# Patient Record
Sex: Female | Born: 1980 | Race: White | Hispanic: No | Marital: Single | Smoking: Current every day smoker
Health system: Southern US, Community
[De-identification: ages and names within clinical notes are randomized; demographics above are authoritative.]

## PROBLEM LIST (undated history)

## (undated) ENCOUNTER — Inpatient Hospital Stay (HOSPITAL_COMMUNITY): Payer: Self-pay

## (undated) DIAGNOSIS — J42 Unspecified chronic bronchitis: Secondary | ICD-10-CM

## (undated) HISTORY — DX: Unspecified chronic bronchitis: J42

## (undated) HISTORY — PX: OTHER SURGICAL HISTORY: SHX169

## (undated) HISTORY — PX: LEEP: SHX91

## (undated) HISTORY — PX: WISDOM TOOTH EXTRACTION: SHX21

---
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
## 1999-01-26 ENCOUNTER — Emergency Department (HOSPITAL_COMMUNITY): Admission: EM | Admit: 1999-01-26 | Discharge: 1999-01-26 | Payer: Self-pay | Admitting: Endocrinology

## 1999-07-25 ENCOUNTER — Inpatient Hospital Stay (HOSPITAL_COMMUNITY): Admission: AD | Admit: 1999-07-25 | Discharge: 1999-07-25 | Payer: Self-pay | Admitting: *Deleted

## 1999-09-18 ENCOUNTER — Inpatient Hospital Stay (HOSPITAL_COMMUNITY): Admission: EM | Admit: 1999-09-18 | Discharge: 1999-09-18 | Payer: Self-pay | Admitting: *Deleted

## 1999-10-06 ENCOUNTER — Inpatient Hospital Stay (HOSPITAL_COMMUNITY): Admission: AD | Admit: 1999-10-06 | Discharge: 1999-10-06 | Payer: Self-pay | Admitting: Obstetrics

## 2000-01-27 ENCOUNTER — Encounter: Admission: RE | Admit: 2000-01-27 | Discharge: 2000-01-27 | Payer: Self-pay | Admitting: Cardiology

## 2000-01-27 ENCOUNTER — Encounter: Payer: Self-pay | Admitting: Cardiology

## 2001-09-06 ENCOUNTER — Other Ambulatory Visit: Admission: RE | Admit: 2001-09-06 | Discharge: 2001-09-06 | Payer: Self-pay | Admitting: Obstetrics

## 2001-10-27 ENCOUNTER — Encounter (INDEPENDENT_AMBULATORY_CARE_PROVIDER_SITE_OTHER): Payer: Self-pay

## 2001-10-27 ENCOUNTER — Ambulatory Visit (HOSPITAL_COMMUNITY): Admission: RE | Admit: 2001-10-27 | Discharge: 2001-10-27 | Payer: Self-pay | Admitting: Obstetrics

## 2002-03-24 ENCOUNTER — Emergency Department (HOSPITAL_COMMUNITY): Admission: EM | Admit: 2002-03-24 | Discharge: 2002-03-24 | Payer: Self-pay | Admitting: *Deleted

## 2002-05-06 ENCOUNTER — Emergency Department (HOSPITAL_COMMUNITY): Admission: EM | Admit: 2002-05-06 | Discharge: 2002-05-06 | Payer: Self-pay

## 2002-10-07 ENCOUNTER — Encounter: Payer: Self-pay | Admitting: Emergency Medicine

## 2002-10-07 ENCOUNTER — Emergency Department (HOSPITAL_COMMUNITY): Admission: EM | Admit: 2002-10-07 | Discharge: 2002-10-07 | Payer: Self-pay | Admitting: Emergency Medicine

## 2003-07-05 ENCOUNTER — Encounter: Admission: RE | Admit: 2003-07-05 | Discharge: 2003-07-05 | Payer: Self-pay | Admitting: Obstetrics and Gynecology

## 2003-08-02 ENCOUNTER — Encounter: Admission: RE | Admit: 2003-08-02 | Discharge: 2003-08-02 | Payer: Self-pay | Admitting: Obstetrics and Gynecology

## 2003-09-03 ENCOUNTER — Inpatient Hospital Stay (HOSPITAL_COMMUNITY): Admission: AD | Admit: 2003-09-03 | Discharge: 2003-09-03 | Payer: Self-pay | Admitting: Obstetrics & Gynecology

## 2003-09-04 ENCOUNTER — Encounter: Admission: RE | Admit: 2003-09-04 | Discharge: 2003-09-04 | Payer: Self-pay | Admitting: Obstetrics and Gynecology

## 2003-09-06 ENCOUNTER — Ambulatory Visit (HOSPITAL_COMMUNITY): Admission: RE | Admit: 2003-09-06 | Discharge: 2003-09-06 | Payer: Self-pay | Admitting: *Deleted

## 2003-11-08 ENCOUNTER — Encounter: Admission: RE | Admit: 2003-11-08 | Discharge: 2003-11-08 | Payer: Self-pay | Admitting: Obstetrics and Gynecology

## 2004-02-21 ENCOUNTER — Ambulatory Visit: Payer: Self-pay | Admitting: Obstetrics and Gynecology

## 2004-02-29 ENCOUNTER — Ambulatory Visit (HOSPITAL_COMMUNITY): Admission: RE | Admit: 2004-02-29 | Discharge: 2004-02-29 | Payer: Self-pay | Admitting: Obstetrics and Gynecology

## 2004-03-17 ENCOUNTER — Other Ambulatory Visit: Admission: RE | Admit: 2004-03-17 | Discharge: 2004-03-17 | Payer: Self-pay | Admitting: Obstetrics and Gynecology

## 2004-07-07 ENCOUNTER — Inpatient Hospital Stay (HOSPITAL_COMMUNITY): Admission: AD | Admit: 2004-07-07 | Discharge: 2004-07-07 | Payer: Self-pay | Admitting: Obstetrics and Gynecology

## 2004-07-14 ENCOUNTER — Inpatient Hospital Stay (HOSPITAL_COMMUNITY): Admission: AD | Admit: 2004-07-14 | Discharge: 2004-07-17 | Payer: Self-pay | Admitting: Obstetrics and Gynecology

## 2004-07-30 ENCOUNTER — Other Ambulatory Visit: Admission: RE | Admit: 2004-07-30 | Discharge: 2004-07-30 | Payer: Self-pay | Admitting: Obstetrics and Gynecology

## 2004-09-07 ENCOUNTER — Inpatient Hospital Stay (HOSPITAL_COMMUNITY): Admission: AD | Admit: 2004-09-07 | Discharge: 2004-09-07 | Payer: Self-pay | Admitting: Obstetrics and Gynecology

## 2004-09-08 ENCOUNTER — Inpatient Hospital Stay (HOSPITAL_COMMUNITY): Admission: AD | Admit: 2004-09-08 | Discharge: 2004-09-13 | Payer: Self-pay | Admitting: Obstetrics and Gynecology

## 2004-11-04 ENCOUNTER — Other Ambulatory Visit: Admission: RE | Admit: 2004-11-04 | Discharge: 2004-11-04 | Payer: Self-pay | Admitting: Obstetrics and Gynecology

## 2005-06-18 ENCOUNTER — Other Ambulatory Visit: Admission: RE | Admit: 2005-06-18 | Discharge: 2005-06-18 | Payer: Self-pay | Admitting: Obstetrics and Gynecology

## 2006-01-12 ENCOUNTER — Emergency Department (HOSPITAL_COMMUNITY): Admission: EM | Admit: 2006-01-12 | Discharge: 2006-01-12 | Payer: Self-pay | Admitting: Emergency Medicine

## 2007-08-05 ENCOUNTER — Inpatient Hospital Stay (HOSPITAL_COMMUNITY): Admission: EM | Admit: 2007-08-05 | Discharge: 2007-08-07 | Payer: Self-pay | Admitting: Emergency Medicine

## 2008-10-13 ENCOUNTER — Emergency Department (HOSPITAL_COMMUNITY): Admission: EM | Admit: 2008-10-13 | Discharge: 2008-10-13 | Payer: Self-pay | Admitting: Emergency Medicine

## 2008-10-15 ENCOUNTER — Emergency Department (HOSPITAL_COMMUNITY): Admission: EM | Admit: 2008-10-15 | Discharge: 2008-10-15 | Payer: Self-pay | Admitting: Emergency Medicine

## 2009-01-17 ENCOUNTER — Emergency Department (HOSPITAL_COMMUNITY): Admission: EM | Admit: 2009-01-17 | Discharge: 2009-01-17 | Payer: Self-pay | Admitting: Emergency Medicine

## 2010-06-22 ENCOUNTER — Encounter: Payer: Self-pay | Admitting: *Deleted

## 2010-10-14 NOTE — Discharge Summary (Signed)
NAMELICIA, HARL NO.:  [id number]   MEDICAL RECORD NO.:  [id number]          PATIENT TYPE:  INP   LOCATION:  1239                         FACILITY:  [facility]   PHYSICIAN:  Isidor Holts, M.D.  DATE OF BIRTH:  [birth year]   DATE OF ADMISSION:  08/05/2007  DATE OF DISCHARGE:  08/07/2007                               DISCHARGE SUMMARY   PMD:  Unassigned.   DISCHARGE DIAGNOSES:  1. Acute gastroenteritis.  2. Dehydration, volume depletion and hypotension.  3. Urinary tract infection.  4. Smoking history.   DISCHARGE MEDICATIONS:  1. Ciprofloxacin 500 mg p.o. b.i.d. for 2 days from August 08, 2007.  2. Loperamide 2 mg p.o. t.i.d. for 2 days only.   PROCEDURES:  None.   CONSULTATIONS:  None.   Admission history.  As H&P notes of August 05, 2007.  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  On initial evaluation, she was  found to be hypotensive and volume depleted, and was admitted for  further evaluation, investigation and management.   CLINICAL COURSE.:  1. Acute gastroenteritis.  For details of presentation, refer to      admission history above.  The patient was noted to have a BP of      93/67 in the emergency department, dropping further, to 87/55 mmHg      even after administration of about 4 liters of intravenous normal      saline.  She was managed with aggressive intravenous fluid      hydration, bowel rest, proton pump inhibitor, antiemetics.  Stool      samples sent off for C.  Difficile toxin were negative.  She      responded to above management measures and by a.m. of August 06, 2007      felt  considerably better and was able to manage clear fluids p.o.      By March 8, she was able to tolerate a regular diet and had no      further symptomatology.  Blood pressure was also found to be      reasonable at 107/60.  The patient was completely asymptomatic and      very keen to be discharged.   1. Dehydration.  The patient presented with profound dehydration,      volume depletion and hypotension.  As mentioned above, she was      managed with aggressive intravenous fluids and as of August 07, 2007,      blood pressure had normalized, hydration status was normal and the      patient was asymptomatic.   1. UTI.  This is an incidental finding.  Urinalysis demonstrated many      bacteria.  The patient was managed with a five day course of  Ciprofloxacin, to be completed on August 09, 2007.   4  Smoking history.  The patient does admit to smoking.  She has been  counseled appropriately, and was managed with Nicoderm patch during the  course of her hospitalization.   DISPOSITION:  The patient was on August 07, 2007, considered sufficiently  clinically recovered and stable to be discharged. She was therefore  discharged accordingly.  She is recommended to return to regular duties  on August 10, 2007.   ACTIVITY:  No restrictions.   DIET:  No restrictions.  Although she is cautioned to avoid raw fruits  and vegetables for the next couple of days   FOLLOW-UP INSTRUCTIONS:  The patient has been recommended to establish  primary MD for routine and preventative care.  However, no immediate  follow-up is indicated at this time.      Isidor Holts, M.D.  Electronically Signed     CO/MEDQ  D:  08/07/2007  T:  08/07/2007  Job:  [id number]

## 2010-10-14 NOTE — H&P (Signed)
Destiny Perry, Destiny Perry NO.:  [id number]   MEDICAL RECORD NO.:  [id number]          PATIENT TYPE:  INP   LOCATION:  1239                         FACILITY:  [facility]   PHYSICIAN:  Isidor Holts, M.D.  DATE OF BIRTH:  May 02, 1981   DATE OF ADMISSION:  08/05/2007  DATE OF DISCHARGE:                              HISTORY & PHYSICAL   PRIMARY CARE PHYSICIAN:  Unassigned.   PRIMARY GYN:  Janine Limbo, M.D.   CHIEF COMPLAINT:  Vomiting, diarrhea and abdominal discomfort for 2  days.   HISTORY OF PRESENT ILLNESS:  This is a 30 year old female.  The patient  is a very good historian.  According to her, she resides in [place],  but had traveled to [place], [place], to attend a funeral on  August 04, 2007.  On the way back, she stopped at a [employer] to eat,  consumed a double cheeseburger with fries at about 3:00 p.m. on August 04, 2007.  While driving back, she felt unwell, got home eventually at 4:30  to 5:00 p.m., her stomach started rumbling, and she had to go to the  bathroom.  While there, she passed liquid mucoid stools, no blood, and  since then, has had approximately 12 bowel movements and vomited at  least 6 times.  Denies coffee-ground emesis.  Has experienced crampy  abdominal discomfort but no fever, although she has had sweats and  chills.  She tried treating herself with Pepto-Bismol and Pedialyte on  August 04, 2007; however, these were quite unhelpful. She now feels very  weak.  Her boyfriend brought her to the emergency department on August 05, 2007, secondary to continuing symptoms.   PAST MEDICAL HISTORY:  1. Status post LEEP procedure January 2003 and cryosurgery June 2005      for cervical dysplasia.  2. History of HPV  3. Previous UTIs.  4. Smoking history.   MEDICATIONS:  IM Depo-Provera every three months, otherwise no other  medications.   ALLERGIES:  No known drug allergies.   REVIEW OF SYSTEMS:  As per HPI and Chief  Complaint.  As the patient ate  alone at [facility], if it is not known if there was any clustering of  cases.  Certainly she has had no sick contacts.   SOCIAL HISTORY:  The patient is single, works as a Social worker.  Has  one offspring who is alive and well. Smokes about one-quarter to one-  half package of cigarettes per day, has done so on and off for the past  10 years.  Drinks alcohol only occasionally.  Denies drug abuse.   FAMILY HISTORY:  The patient's parents are still living.  Mother is age  72 years, is a smoker and is hypertensive.  Father is age 47 years and  alive and well.  Family history is otherwise noncontributory.   PHYSICAL EXAMINATION:  VITAL SIGNS:  Temperature 97.9, pulse 76 per  minute and regular, respiratory rate 18, blood pressure initially 93/67  mmHg, now 87/55 mmHg, pulse oximeter 100% on room air.  GENERAL:  The patient does not appear to be  in obvious acute distress at  time of this evaluation, alert, communicative, not short of breath at  rest.  HEENT:  No clinical pallor or jaundice.  No conjunctival injection.  Throat: Visible mucous membranes appear dry.  NECK:  Supple.  JVP not seen.  No palpable lymphadenopathy.  No palpable  goiter.  No carotid bruits.  CHEST:  Clinically clear to auscultation.  No wheezes or crackles.  CARDIAC:  Heart sounds 1 and 2 heard, normal, regular, no murmurs.  ABDOMEN:  Flat, soft, nontender.  No palpable organomegaly or palpable  masses.  Brisk bowel sounds.  EXTREMITIES:  Lower extremity examination: No pitting edema.  Palpable  peripheral pulses.  MUSCULOSKELETAL SYSTEM:  Unremarkable.  CENTRAL NERVOUS SYSTEM:  No focal neurologic deficit on gross  examination.   INVESTIGATIONS:  CBC:  WBC 8.3, hemoglobin 15.2, hematocrit 43.3,  platelets 252.  Electrolytes:  Sodium 137, potassium 4.4, chloride 104,  CO2 26, BUN 14, creatinine 1.08, glucose 87.  AST 24, ALT 14, alkaline  phosphatase 57.  Urinalysis:  3-6 wbc's,  0-2 rbc's, bacteria many.   ASSESSMENT AND PLAN:  1. Acute gastroenteritis, query etiology.  Given short incubation      period as well as history of ingestion of a double cheeseburger,      one has to suspect bacterial food poisoning, possibly      staphylococcal etiology.  We shall admit the patient in the step-      down unit given hypotension, for close monitoring.  Institute bowel      rest, intravenous fluid hydration, antiemetics, proton pump      inhibitor and carry out stool studies including stool microscopy as      well as stool for C. difficile toxin, for completeness.  Because of      high frequency of diarrhea, we may need to utilize p.r.n.,      Loperamide for symptomatic relief.   1. Dehydration and hypotension.  This is secondary to #1 above.  We      shall manage with aggressive intravenous fluid hydration.   1. Urinary tract infection.  We shall commence Ciprofloxacin. This may      also be helpful for infective diarrhea.  We shall send off urine      for culture and sensitivity.   1. Smoking history.  The patient has been counseled appropriately.  We      shall utilize Nicoderm CQ patch.   Further management will depend on clinical course.      Isidor Holts, M.D.  Electronically Signed     CO/MEDQ  D:  08/05/2007  T:  08/07/2007  Job:  [id number]   cc:   Janine Limbo, M.D.  Fax: [phone]

## 2010-10-17 NOTE — Group Therapy Note (Signed)
Destiny Perry, Destiny Perry                        ACCOUNT NO.:  [id number]   MEDICAL RECORD NO.:  [id number]                   PATIENT TYPE:  OUT   LOCATION:  [facility]                           FACILITY:  [facility]   PHYSICIAN:  Argentina Donovan, MD                     DATE OF BIRTH:  Apr 28, 1981   DATE OF SERVICE:  09/04/2003                                    CLINIC NOTE   REASON FOR VISIT:  The patient is a 30 year old white female gravida 1 para  0-0-1-0 who has a history of VIP in spring 2003.  She had a LEEP for  moderate cervical dysplasia at that time and with no children in spite of a  moderate dysplasia we decided to try cryosurgery.  The patient could not  tolerate it because of the severe cramping although we did get in a 2-minute  freeze which is not sufficient.  We decided to repeat her Pap smear again in  3 months.  That was done on March 3.  The patient had a period then on March  4.  She then had another period 2 weeks ago when she started the Ortho Tri-  Cyclen Lo.  Then on the day prior to coming in today - i.e. September 03, 2003 -  she had an acute onset of right lower quadrant pain; no nausea, vomiting,  guarding, rebound, but woke her out of a sleep abruptly and she began  bleeding.  She was seen in MAU.  Cultures were done - all negative, and the  patient was treated with Zithromax, Vantin, Toradol, and she comes in today  - the next day - with persistent pain, somewhat increased.  The abdomen is  soft, flat, tender to palpation of the right lower quadrant without guarding  or rebound.  No adnexal mass palpable.  External genitalia normal.  The BUS  within normal limits.  Vagina showed a mild cervicitis and the uterus was  normal size, shape, consistency.  No cyst could be felt in that area.  We  have switched the patient to Ovcon-35.  She is currently on Motrin which has  not helped the pain much and we will give her an additional prescription for  Percocet.   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  Will get  an ultrasound and if the pain continues I would do a laparoscopic  examination at which time I would treat the cervix again.                                               Argentina Donovan, MD    PR/MEDQ  D:  09/04/2003  T:  09/04/2003  Job:  [id number]
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
## 2010-10-17 NOTE — Discharge Summary (Signed)
NAMEACIRE, TANG NO.:  [id number]   MEDICAL RECORD NO.:  [id number]          PATIENT TYPE:  INP   LOCATION:  9154                          [facility]ACILITY:  [facility]   PHYSICIAN:  Janine Limbo, M.D.DATE OF BIRTH:  1981/05/18   DATE OF ADMISSION:  07/14/2004  DATE OF DISCHARGE:  07/17/2004                                 DISCHARGE SUMMARY   ADMITTING DIAGNOSES:  1.  Intrauterine pregnancy at 27-and-a-half weeks.  2.  Preterm labor.   DISCHARGE DIAGNOSIS:  Preterm contractions at 27 weeks but stable.   PROCEDURES:  1.  Betamethasone course.  2.  Magnesium sulfate therapy.   HOSPITAL COURSE:  Ms. Fager is a 30 year old gravida 2 para 0-0-1-0 at 85  and three-sevenths weeks who was sent from the office on July 14, 2004  for monitoring secondary to cervical change on exam. She had been seen for  contractions approximately 1 week ago, was begun on terbutaline on a p.r.n.  basis, and had negative fetal fibronectin. She was seen on February 8 at the  office and the cervix was closed, 20%, vertex low, and today was evaluated  following complaints of contractions every 10 minutes. Cervix was fingertip  to 1, 80-90%, vertex at a 0 station by a different examiner. Pregnancy had  been remarkable for:  1.  History of a LEEP procedure with last cervical length at 22 weeks of 3.0      cm.  2.  HSV 1.  3.  Positive group B strep.   On admission, cervix was fingertip, 60%, vertex at a 0 station. Uterine  contractions were every 2-6 minutes. She had been given IV fluid and  Procardia in maternity admissions. The intensity of the contractions had  diminished slightly but they were still present and still painful. She was  therefore admitted to the antenatal unit per consult with Dr. Pennie Rushing.  Magnesium sulfate was begun and betamethasone course was also begun. PIH  labs were done for baseline. These were normal. Fetal heart rate remained  reactive. On July 16, 2004  the patient was doing well. She was having  occasional contractions. Fetal heart rate was reactive. She was having 1-2  contractions per hour. The decision was made to wean the magnesium sulfate  off, which was done. By February 16 the patient was doing well. She was  aware of occasional tightening but had no pain with contractions. Uterine  contractions on the monitor were every 4-6 minutes through the night with a  full bladder, now were occasional after voiding. Dr. Stefano Gaul evaluated the  patient and her cervix was closed, 25%, vertex was -3 station. Uterine  contractions were every 4-12 minutes. She was given p.o. terbutaline and  this diminished the contractions to uterine irritability only. The patient  had requested to go home that evening. Dr. Stefano Gaul saw the patient and  determined that he would let her go home. She was deemed to have received  the full benefit of her hospital stay and was discharged home on the evening  of July 17, 2004.   DISCHARGE INSTRUCTIONS:  The patient  is to maintain rest. She is to be on  pelvic rest.   DISCHARGE MEDICATIONS:  1.  Terbutaline 2.5 mg p.o. q.4h. as needed for contractions.  2.  Prenatal vitamin one p.o. daily.   DISCHARGE FOLLOW-UP:  Will occur in 1 week at [facility] or on  a p.r.n. basis.   DISCHARGE CONDITION:  Stable.      VLL/MEDQ  D:  07/18/2004  T:  07/18/2004  Job:  [id number]

## 2010-10-17 NOTE — Group Therapy Note (Signed)
NAME:  Destiny Perry, FACKLER NO.:  [id number]   MEDICAL RECORD NO.:  [id number]                   PATIENT TYPE:  OUT   LOCATION:  [facility]                           [facility]ACILITY:  [facility]   PHYSICIAN:  Argentina Donovan, MD                     DATE OF BIRTH:  June 06, 1980   DATE OF SERVICE:  11/08/2003                                    CLINIC NOTE   REASON FOR VISIT:  This patient is a 30 year old gravida 1 para 0-0-1-0 with  a history of a LEEP for severe cervical dysplasia who had a recurrent  cervical dysplasia on a colposcopy in the recent past.  Because of her age  we decided we would try cryosurgery on her.  She has an extremely sensitive  uterus that causes severe cramping with her periods, when she had her  abortion apparently, and when we attempted several months ago to do a  cryosurgery which we were able to do one time for 2 minutes before she was  screaming in pain.  Today we did a paracervical block using 10 mL of 1%  Xylocaine at 8 o'clock and 10 mL at 4 o'clock in the paracervical area and  waited 10 minutes before we proceeded.  We were able to do two sessions of 2-  minute freezing with the nitrous oxide today but could not go beyond the 2  minutes because of the patient's discomfort.  We will repeat her Pap smear  in 4 months and she has already had three virtual episodes of cryo of 2  minutes each.  If that does not work in resolving her problem we may have to  do a secondary LEEP.   IMPRESSION:  1. Cervical intraepithelial neoplasia grade 2.  2. Severe dysplasia.                                               Argentina Donovan, MD    PR/MEDQ  D:  11/08/2003  T:  11/08/2003  Job:  [id number]

## 2010-10-17 NOTE — Discharge Summary (Signed)
NAMEDELIA, SLATTEN NO.:  [id number]   MEDICAL RECORD NO.:  [id number]          PATIENT TYPE:  INP   LOCATION:  9133                          FACILITY:  [facility]   PHYSICIAN:  Janine Limbo, M.D.DATE OF BIRTH:  1981-02-17   DATE OF ADMISSION:  09/08/2004  DATE OF DISCHARGE:  09/13/2004                                 DISCHARGE SUMMARY   ADMISSION DIAGNOSES:  1.  Intrauterine pregnancy at 36 weeks.  2.  Fetal heart rate changes.  3.  Preterm contractions.   DISCHARGE DIAGNOSES:  1.  Intrauterine pregnancy at 36 weeks.  2.  Fetal heart rate changes.  3.  Preterm contractions.   PROCEDURE:  1.  Vacuum assisted vaginal delivery.  2.  Repair of second-degree midline laceration.   HOSPITAL COURSE:  Ms. Fern is a 30 year old single white female, gravida  2, para 0-0-1-0, who presented to maternity admissions at 35-4/7 weeks,  complaining of uterine contractions every 3-5 minutes that are increasing in  intensity but have not had any significant cervical change.  She had  multiple maternity admission evaluations in the 24 hours preceding September 08, 2004, and was sent home with Ambien and continued to complain of  contractions.  Her pregnancy had been followed by the [facility]  OB/GYN MD service and was remarkable for (1) History of LEEP and cryosurgery  in June 2005.  (2) History of frequent urinary tract infections.  (3)  History of positive group B strep.  (4) History of preterm uterine  contractions without significant cervical change since 26 weeks in the  pregnancy.  The patient was initially admitted and given IV fluids and pain  medications and allowed to rest.  She was observed on the night of September 08, 2004.  During the time she was observed, she was having sporadic prolonged  decelerations, approximately one every 2-3 hours, usually associated with  getting up to the bathroom and also with a prolonged uterine contraction.  Otherwise, fetal  heart rate has had periods of reactivity.  Uterine  contractions remained every 4-5 minutes and mild.  Amnio was performed for  maturity and came back with positive PG, and LS ratio and PG are normal.  On  the evening of September 10, 2004, option of Pitocin induction was given to the  patient, and she agreed to proceed.  By the morning of the 13th, her cervix  remained 2 cm, and contractions every 1-2 minutes.  At that point, her  membranes were artificially ruptured for clear fluid, and an IUPC was  placed.  By later that morning, the patient progressed to 5 cm.  By 11 that  morning, her cervix was completely dilated.  Fetal heart rate was  reassuring.  The patient was complaining of severe back and shoulder pain  which has been a problem in the past.  She was pushing poorly secondary to  the neck pain and was given Flexeril and then allowed to rest and labor  down.  Maternal exhaustion and pain continued to be a problem, and therefore  the options of a vacuum-assisted vaginal  delivery was presented to the  patient by Dr. Dierdre Forth, and the patient consented.  Infant was a  viable female, named [place].  Weight was 5 pounds and 14 ounces, and  Apgars were 7 and 8.  Infant was taken to the full-term nursery and was  doing well.  The patient tolerated the procedure well and by postpartum day  #1, her vital signs were stable.  Her hemoglobin was 12.5 and had been 12.8  predelivery.  By postpartum day #2, the patient continued to do well,  expressed desire to begin Ortho-Tri-Cyclen for contraception.  Her vital  signs remained stable, and she was deemed to have received the full benefit  of her hospital stay.   DISCHARGE INSTRUCTIONS:  Per [facility] handout.   DISCHARGE MEDICATIONS:  Motrin 600 mg 1 p.o. q.6h. p.r.n. pain.   Discharge follow up will occur at [facility] in approximately 6 weeks  or as needed.      KS/MEDQ  D:  09/13/2004  T:  09/13/2004  Job:   [id number]

## 2010-10-17 NOTE — Op Note (Signed)
Destiny Perry, GUISE NO.:  [id number]   MEDICAL RECORD NO.:  [id number]          PATIENT TYPE:  INP   LOCATION:  9133                          [facility]ACILITY:  [facility]   PHYSICIAN:  Hal Morales, M.D.DATE OF BIRTH:  April 09, 1981   DATE OF PROCEDURE:  09/11/2004  DATE OF DISCHARGE:                                 OPERATIVE REPORT   PREOPERATIVE DIAGNOSIS:  Intrauterine pregnancy at 36 weeks, fetal heart  rate deceleration, preterm contractions, maternal exhaustion.   POSTOPERATIVE DIAGNOSIS:  Intrauterine pregnancy at 36 weeks, fetal heart  rate deceleration, preterm contractions, maternal exhaustion.   OPERATION:  Vacuum assisted vaginal delivery, repair of second degree  midline laceration.   ANESTHESIA:  Epidural and local for repair of laceration.   ESTIMATED BLOOD LOSS:  Less than 500 mL.   COMPLICATIONS:  None.   FINDINGS:  The patient was delivered of a female infant whose name is [name] with Apgars of 7 and 8 at 1 and 5 minutes respectively.  The weight  was pending at the time of dictation.   PREOPERATIVE DISCUSSION:  The patient had been complete since approximately  11:20.  She complained of significant back and neck pain which had been a  problem throughout her evening of labor.  She stated that she was unable to  even consider pushing because of severe pain.  She was medicated with  Flexeril 10 mg p.o. with excellent pain relief from her back and neck pain.  Subsequent to that, however, she complained of severe right lower quadrant  pain and felt that she was unable to push because of the severity of that  pain.  The anesthesiologist, Dr. Jean Rosenthal, was consulted, redosed her  epidural, and the patient felt that she wanted to rest.  During her rest  period, she continued to have the head labor down into the pelvis.  At  approximately 1:30 p.m., the patient began pushing again and pushed for  approximately one hour before she felt that she  could not push any further.  At that time, the vertex was at +3 station.  Significant maternal vulvar  edema was present.  In light of her inability to push further, a discussion  was held with the patient concerning her options for management.  The first  option was to continue observation with her needing to push further.  The  second option was to proceed with cesarean section for delivery of her baby.  The third option was to attempt a vacuum assisted vaginal delivery.  The  risks and benefits of each of these options were outlined.  The risks of  cesarean section were reviewed as anesthesia, bleeding, infection, damage to  adjacent organs.  The risks of vacuum assisted vaginal delivery were  outlined as damage to maternal tissues, cephalohematoma, inability to effect  vaginal delivery of the head, and ability to effect vaginal delivery of the  head with inability to effect vaginal delivery of the remainder of the baby.  After a discussion between the parents, they decided to proceed with an  attempt at vacuum assisted vaginal delivery.  PROCEDURE:  The patient was already in the lithotomy position for pushing.  The perineum was prepped with multiple layers of Betadine and draped.  A red  Robinson catheter was used to empty the bladder.  A Kiwi vacuum extractor  was used to place over the fetal vertex and over the next two tractions,  coordinating vacuum use with patient pushing, the fetal vertex was delivered  over the intact perineum.  The remainder of the infant was delivered with a  combination of maternal expulsive efforts and gentle traction.  The infant  was suctioned and the cord clamped and cut.  She was handed off to the  awaiting pediatricians.  Cord blood was drawn and the placenta removed with  a combination of gentle traction and maternal expulsive efforts.  The  midline second degree laceration was closed in a layered fashion with 3-0  Vicryl.  Ice packs were placed on  the perineum.  The infant had been left in  the labor delivery recovery room for initial bonding with the mother.      VPH/MEDQ  D:  09/11/2004  T:  09/11/2004  Job:  [id number]

## 2010-10-17 NOTE — Op Note (Signed)
[facility]  Patient:    Destiny Perry, Destiny Perry Visit Number: [id number] MRN: [id number]          Service Type: DSU Location: [facility] Attending Physician:  Tammi Sou Dictated by:   Bing Neighbors Clearance Coots, M.D. Proc. Date: 10/27/01 Admit Date:  10/27/2001                             Operative Report  PREOPERATIVE DIAGNOSES:       CIN-2 (cervical intraepithelial neoplasia).  POSTOPERATIVE DIAGNOSES:      CIN-2 (cervical intraepithelial neoplasia).  PROCEDURE:                    Loop electrosurgical excision procedure.  SURGEON:                      Charles A. Clearance Coots, M.D.  ANESTHESIA:                   MAC with paracervical block.  ESTIMATED BLOOD LOSS:         Negligible.  COMPLICATIONS:                None.  SPECIMEN:                     Cervical cone.  OPERATION:                    The patient was brought to the operating room and after satisfactory IV sedation the legs were brought up in stirrups and the vagina was draped in a routine sterile fashion.  A sterile LEEP speculum was inserted in the vaginal vault and the cervix was isolated.  The cervix was then infiltrated with approximately 10 ml of 1% Xylocaine with 1:200,000 dilution of epinephrine throughout the cervical stroma.  A 20 x 12 wire loop was then used for the procedure and the power rating on the generator was a blend of the 75 cutting and 50 coag.  The LEEP specimen was obtained in routine fashion with a 20 x 12 mm wire loop and specimen was submitted to pathology for evaluation.  The roller ball electrode was then used to feather the edges of the cervical base and areas of bleeding points within the base of the conization specimen.  Amino-Cerv cream was then applied to the conization base for aid in healing.  There was no active bleeding at the conclusion of the procedure.  All instruments were retired.  Patient tolerated procedure well and was transported to recovery  room in satisfactory condition. Dictated by:   Bing Neighbors Clearance Coots, M.D. Attending Physician:  Tammi Sou DD:  10/27/01 TD:  10/28/01 Job: [id number]
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
## 2010-12-31 ENCOUNTER — Encounter: Payer: Self-pay | Admitting: Emergency Medicine

## 2010-12-31 ENCOUNTER — Emergency Department (HOSPITAL_COMMUNITY)
Admission: EM | Admit: 2010-12-31 | Discharge: 2010-12-31 | Discharge status: Against Medical Advice | Payer: Self-pay | Attending: Emergency Medicine | Admitting: Emergency Medicine

## 2010-12-31 ENCOUNTER — Emergency Department (HOSPITAL_BASED_OUTPATIENT_CLINIC_OR_DEPARTMENT_OTHER)
Admission: EM | Admit: 2010-12-31 | Discharge: 2011-01-01 | Disposition: A | Discharge status: Home / Self Care | Payer: Self-pay | Attending: Emergency Medicine | Admitting: Emergency Medicine

## 2010-12-31 DIAGNOSIS — S90569A Insect bite (nonvenomous), unspecified ankle, initial encounter: Secondary | ICD-10-CM | POA: Insufficient documentation

## 2010-12-31 DIAGNOSIS — M79609 Pain in unspecified limb: Secondary | ICD-10-CM | POA: Insufficient documentation

## 2010-12-31 DIAGNOSIS — T6391XA Toxic effect of contact with unspecified venomous animal, accidental (unintentional), initial encounter: Secondary | ICD-10-CM | POA: Insufficient documentation

## 2010-12-31 DIAGNOSIS — T148 Other injury of unspecified body region: Secondary | ICD-10-CM | POA: Insufficient documentation

## 2010-12-31 DIAGNOSIS — R21 Rash and other nonspecific skin eruption: Secondary | ICD-10-CM | POA: Insufficient documentation

## 2010-12-31 DIAGNOSIS — W57XXXA Bitten or stung by nonvenomous insect and other nonvenomous arthropods, initial encounter: Secondary | ICD-10-CM | POA: Insufficient documentation

## 2010-12-31 DIAGNOSIS — F17200 Nicotine dependence, unspecified, uncomplicated: Secondary | ICD-10-CM | POA: Insufficient documentation

## 2010-12-31 DIAGNOSIS — T63461A Toxic effect of venom of wasps, accidental (unintentional), initial encounter: Secondary | ICD-10-CM | POA: Insufficient documentation

## 2010-12-31 NOTE — ED Notes (Addendum)
Pt has 2 bee stings right ankle and right buttock area last night. Pt went to [facility] today and did not wait to be seen. Pt smells of ETOH.

## 2011-01-01 MED ORDER — PREDNISONE 50 MG PO TABS: 50.0000 mg | ORAL_TABLET | Freq: Every day | ORAL | Status: AC

## 2011-01-01 MED ORDER — HYDROCORTISONE 1 % EX CREA: TOPICAL_CREAM | CUTANEOUS | Status: AC

## 2011-01-01 MED ORDER — PREDNISONE 20 MG PO TABS
50.0000 mg | ORAL_TABLET | Freq: Once | ORAL | Status: AC
  Administered 2011-01-01: 50 mg via ORAL
  Filled 2011-01-01 (×2): qty 1

## 2011-01-01 MED ORDER — EPINEPHRINE 0.3 MG/0.3ML IJ DEVI: 0.3000 mg | Freq: Once | INTRAMUSCULAR | Status: DC

## 2011-01-01 NOTE — ED Provider Notes (Signed)
History     CSN: [id number] Arrival date & time: 12/31/2010 11:45 PM  Chief Complaint  Patient presents with  . Insect Bite   HPI Comments: C/o wasp bite to R ankle and R buttock yesterday.  Has itching, pain and redness at site.  No difficulty breathing or swallowing.  Using benadryl without relief.  No fever, vomiting, abdominal pain, wheezing, cough.  No chest pain or SOB.  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   The history is provided by the patient.    History reviewed. No pertinent past medical history.  Past Surgical History  Procedure Date  . Leep     No family history on file.  History  Substance Use Topics  . Smoking status: Current Everyday Smoker  . Smokeless tobacco: Not on file  . Alcohol Use: Yes    OB History    Grav Para Term Preterm Abortions TAB SAB Ect Mult Living                  Review of Systems  Constitutional: Negative for fever, activity change and appetite change.  HENT: Negative for congestion and rhinorrhea.   Respiratory: Negative for cough and shortness of breath.   Cardiovascular: Negative for chest pain.  Gastrointestinal: Negative for nausea, vomiting and abdominal pain.  Genitourinary: Negative for dysuria and hematuria.  Skin: Positive for rash and wound.  Neurological: Negative for headaches.    Physical Exam  BP 114/77  Pulse 78  Temp(Src) 97.9 F (36.6 C) (Oral)  Resp 18  SpO2 100%  Physical Exam  Constitutional: She is oriented to person, place, and time. She appears well-developed and well-nourished. No distress.  HENT:  Head: Normocephalic and atraumatic.  Mouth/Throat: Oropharynx is clear and moist.  Eyes: Pupils are equal, round, and reactive to light.  Neck: Normal range of motion.  Cardiovascular: Normal rate and regular rhythm.   Pulmonary/Chest: Effort normal. No respiratory distress.  Abdominal: Soft. There is no tenderness. There is no rebound and no guarding.    Musculoskeletal: Normal range of motion. She exhibits no edema and no tenderness.  Neurological: She is alert and oriented to person, place, and time. No cranial nerve deficit.  Skin: Skin is warm.       R lateral ankle with erythema and excoriation. R buttock with 3x3 cm area of erythema. No warmth or flucutance    ED Course  Procedures  MDM Insect bites/stings with local reaction, no respiratory or systemic symptoms. Steroids, antihistamines, local care Will refill epi pen.      Glynn Octave, MD 01/01/11 [phone]

## 2011-02-23 LAB — CBC
HCT: 43.3
Hemoglobin: 10.7 — ABNORMAL LOW
MCHC: 35
MCV: 94.5
MCV: 94.9
Platelets: 252
RBC: 3.18 — ABNORMAL LOW
RDW: 12.2
RDW: 12.5

## 2011-02-23 LAB — URINALYSIS, ROUTINE W REFLEX MICROSCOPIC
Bilirubin Urine: NEGATIVE
Ketones, ur: NEGATIVE
Protein, ur: NEGATIVE
Specific Gravity, Urine: 1.031 — ABNORMAL HIGH

## 2011-02-23 LAB — COMPREHENSIVE METABOLIC PANEL
BUN: 14
CO2: 26
Chloride: 104
GFR calc non Af Amer: >60
Glucose, Bld: 87
Potassium: 4.4
Total Bilirubin: 1.2

## 2011-02-23 LAB — CULTURE, BLOOD (ROUTINE X 2): Culture: NO GROWTH

## 2011-02-23 LAB — CLOSTRIDIUM DIFFICILE EIA
C difficile Toxins A+B, EIA: NEGATIVE
C difficile Toxins A+B, EIA: NEGATIVE

## 2011-02-23 LAB — BASIC METABOLIC PANEL
CO2: 19
CO2: 21
Calcium: 8.3 — ABNORMAL LOW
Chloride: 118 — ABNORMAL HIGH
Creatinine, Ser: 0.87
Creatinine, Ser: 0.96
GFR calc non Af Amer: >60
Glucose, Bld: 85
Glucose, Bld: 86

## 2011-02-23 LAB — DIFFERENTIAL
Lymphs Abs: 2.2
Monocytes Relative: 8
Neutro Abs: 5.1
Neutrophils Relative %: 62

## 2011-02-23 LAB — URINE CULTURE: Colony Count: 70000

## 2011-02-23 LAB — MAGNESIUM: Magnesium: 1.7

## 2011-02-23 LAB — STOOL CULTURE

## 2011-02-23 LAB — B-NATRIURETIC PEPTIDE (CONVERTED LAB)
Pro B Natriuretic peptide (BNP): 53
Pro B Natriuretic peptide (BNP): 79.3

## 2011-02-23 LAB — URINE MICROSCOPIC-ADD ON

## 2012-05-30 ENCOUNTER — Inpatient Hospital Stay (HOSPITAL_COMMUNITY)
Admission: AD | Admit: 2012-05-30 | Discharge: 2012-05-30 | Disposition: A | Discharge status: Home / Self Care | Payer: Self-pay | Source: Ambulatory Visit | Attending: Obstetrics and Gynecology | Admitting: Obstetrics and Gynecology

## 2012-05-30 ENCOUNTER — Inpatient Hospital Stay (HOSPITAL_COMMUNITY): Payer: Self-pay

## 2012-05-30 DIAGNOSIS — N920 Excessive and frequent menstruation with regular cycle: Secondary | ICD-10-CM | POA: Insufficient documentation

## 2012-05-30 DIAGNOSIS — R109 Unspecified abdominal pain: Secondary | ICD-10-CM | POA: Insufficient documentation

## 2012-05-30 DIAGNOSIS — N946 Dysmenorrhea, unspecified: Secondary | ICD-10-CM | POA: Insufficient documentation

## 2012-05-30 LAB — CBC WITH DIFFERENTIAL/PLATELET
Eosinophils Absolute: 0.1 10*3/uL (ref 0.0–0.7)
Eosinophils Relative: 1 % (ref 0–5)
HCT: 40.4 % (ref 36.0–46.0)
Hemoglobin: 13.7 g/dL (ref 12.0–15.0)
Lymphocytes Relative: 18 % (ref 12–46)
Lymphs Abs: 2.4 10*3/uL (ref 0.7–4.0)
MCH: 33.6 pg (ref 26.0–34.0)
MCV: 99 fL (ref 78.0–100.0)
Monocytes Absolute: 0.6 10*3/uL (ref 0.1–1.0)
Monocytes Relative: 4 % (ref 3–12)
RBC: 4.08 MIL/uL (ref 3.87–5.11)
WBC: 13.7 10*3/uL — ABNORMAL HIGH (ref 4.0–10.5)

## 2012-05-30 LAB — URINALYSIS, ROUTINE W REFLEX MICROSCOPIC
Bilirubin Urine: NEGATIVE
Ketones, ur: NEGATIVE mg/dL
Nitrite: NEGATIVE
Specific Gravity, Urine: >1.03 — ABNORMAL HIGH (ref 1.005–1.030)
pH: 6 (ref 5.0–8.0)

## 2012-05-30 LAB — URINE MICROSCOPIC-ADD ON

## 2012-05-30 LAB — WET PREP, GENITAL: Yeast Wet Prep HPF POC: NONE SEEN

## 2012-05-30 MED ORDER — KETOROLAC TROMETHAMINE 60 MG/2ML IM SOLN
60.0000 mg | Freq: Once | INTRAMUSCULAR | Status: AC
  Administered 2012-05-30: 60 mg via INTRAMUSCULAR
  Filled 2012-05-30: qty 2

## 2012-05-30 MED ORDER — OXYCODONE-ACETAMINOPHEN 5-325 MG PO TABS: 1.0000 | ORAL_TABLET | ORAL | Status: DC | PRN

## 2012-05-30 MED ORDER — HYDROMORPHONE HCL PF 1 MG/ML IJ SOLN: 1.0000 mg | Freq: Once | INTRAMUSCULAR | Status: DC

## 2012-05-30 MED ORDER — HYDROMORPHONE HCL PF 1 MG/ML IJ SOLN
1.0000 mg | Freq: Once | INTRAMUSCULAR | Status: AC
  Administered 2012-05-30: 1 mg via INTRAMUSCULAR

## 2012-05-30 MED ORDER — HYDROMORPHONE HCL PF 1 MG/ML IJ SOLN
INTRAMUSCULAR | Status: AC
  Filled 2012-05-30: qty 1

## 2012-05-30 MED ORDER — IOHEXOL 300 MG/ML  SOLN
100.0000 mL | Freq: Once | INTRAMUSCULAR | Status: AC | PRN
  Administered 2012-05-30: 100 mL via INTRAVENOUS

## 2012-05-30 MED ORDER — IBUPROFEN 600 MG PO TABS: 600.0000 mg | ORAL_TABLET | Freq: Four times a day (QID) | ORAL | Status: DC | PRN

## 2012-05-30 NOTE — MAU Provider Note (Signed)
History     CSN: [id number]  Arrival date and time: 05/30/12 1308   First Provider Initiated Contact with Patient 05/30/12 1604      Chief Complaint  Patient presents with  . Possible Pregnancy  . Abdominal Cramping   Patient is a 31 y.o. female presenting with cramps.  Abdominal Cramping The primary symptoms of the illness include abdominal pain. The primary symptoms of the illness do not include fever, nausea or vomiting.  Symptoms associated with the illness do not include chills.   Destiny Perry is 31 y.o. lower right abdominal pain that has been intermittent for 1 week.  Worsened last night.  Took a flexaril and aleve last night without relief.  Describes as 9/10.  Denies nausea, vomiting and diarrhea as well as fever or chills. States she does not have an appetite.   Hx of LEEP, cryo.  Hx of ovarian cyst years ago but feels like that.  LMP ?  Expected 10 days ago but didn't come. Began bleeding Christmas Day, bleeding since.  Denies clots  Sexually active X 1 partner.  Uses OrthoTriCyclen for contraception.  Doubles up when she misses pill--doubles up at least one X week.      No past medical history on file.  Past Surgical History  Procedure Date  . Leep     No family history on file.  History  Substance Use Topics  . Smoking status: Current Every Day Smoker  . Smokeless tobacco: Not on file  . Alcohol Use: Yes    Allergies:  Allergies  Allergen Reactions  . Metronidazole Hives  . Nutritional Supplements     Prescriptions prior to admission  Medication Sig Dispense Refill  . EPINEPHrine (EPIPEN) 0.3 mg/0.3 mL DEVI Inject 0.3 mLs (0.3 mg total) into the muscle once.  1 Device  0  . medroxyPROGESTERone (DEPO-PROVERA) 150 MG/ML injection Inject into the muscle every 3 (three) months.          Review of Systems  Constitutional: Negative for fever and chills.  Gastrointestinal: Positive for abdominal pain. Negative for nausea and vomiting.  Genitourinary:         + bleeding   Physical Exam   Blood pressure 123/88, pulse 99, temperature 98.7 F (37.1 C), temperature source Oral, resp. rate 16, height 5\' 4"  (1.626 m), weight 118 lb (53.524 kg), SpO2 100.00%.  Physical Exam  Constitutional: She is oriented to person, place, and time. She appears well-developed and well-nourished. No distress.  HENT:  Head: Normocephalic.  Neck: Normal range of motion.  Cardiovascular: Normal rate.   Respiratory: Effort normal.  GI: Soft. She exhibits no distension and no mass. There is tenderness (right lower abdominal pain). There is no rebound and no guarding.  Genitourinary: There is no tenderness or lesion on the right labia. There is no tenderness or lesion on the left labia. Uterus is not enlarged and not tender. Cervix exhibits no discharge. Right adnexum displays tenderness and fullness. Left adnexum displays no mass, no tenderness and no fullness. There is bleeding (small amount of bleeding with 1 clot) around the vagina. No vaginal discharge found.  Neurological: She is alert and oriented to person, place, and time.  Skin: Skin is warm and dry.  Psychiatric: She has a normal mood and affect. Her behavior is normal.   Results for orders placed during the hospital encounter of 05/30/12 (from the past 24 hour(s))  URINALYSIS, ROUTINE W REFLEX MICROSCOPIC     Status: Abnormal   Collection  Time   05/30/12  2:35 PM      Component Value Range   Color, Urine YELLOW  YELLOW   APPearance HAZY (*) CLEAR   Specific Gravity, Urine >1.030 (*) 1.005 - 1.030   pH 6.0  5.0 - 8.0   Glucose, UA NEGATIVE  NEGATIVE mg/dL   Hgb urine dipstick LARGE (*) NEGATIVE   Bilirubin Urine NEGATIVE  NEGATIVE   Ketones, ur NEGATIVE  NEGATIVE mg/dL   Protein, ur 960 (*) NEGATIVE mg/dL   Urobilinogen, UA 0.2  0.0 - 1.0 mg/dL   Nitrite NEGATIVE  NEGATIVE   Leukocytes, UA NEGATIVE  NEGATIVE  URINE MICROSCOPIC-ADD ON     Status: Abnormal   Collection Time   05/30/12  2:35 PM       Component Value Range   Squamous Epithelial / LPF FEW (*) RARE   WBC, UA 21-50  <3 WBC/hpf   RBC / HPF 21-50  <3 RBC/hpf   Bacteria, UA MANY (*) RARE   Urine-Other MUCOUS PRESENT    POCT PREGNANCY, URINE     Status: Normal   Collection Time   05/30/12  2:44 PM      Component Value Range   Preg Test, Ur NEGATIVE  NEGATIVE  CBC WITH DIFFERENTIAL     Status: Abnormal   Collection Time   05/30/12  4:08 PM      Component Value Range   WBC 13.7 (*) 4.0 - 10.5 K/uL   RBC 4.08  3.87 - 5.11 MIL/uL   Hemoglobin 13.7  12.0 - 15.0 g/dL   HCT 45.4  09.8 - 11.9 %   MCV 99.0  78.0 - 100.0 fL   MCH 33.6  26.0 - 34.0 pg   MCHC 33.9  30.0 - 36.0 g/dL   RDW 14.7  82.9 - 56.2 %   Platelets 302  150 - 400 K/uL   Neutrophils Relative 78 (*) 43 - 77 %   Neutro Abs 10.6 (*) 1.7 - 7.7 K/uL   Lymphocytes Relative 18  12 - 46 %   Lymphs Abs 2.4  0.7 - 4.0 K/uL   Monocytes Relative 4  3 - 12 %   Monocytes Absolute 0.6  0.1 - 1.0 K/uL   Eosinophils Relative 1  0 - 5 %   Eosinophils Absolute 0.1  0.0 - 0.7 K/uL   Basophils Relative 0  0 - 1 %   Basophils Absolute 0.0  0.0 - 0.1 K/uL   Clinical Data: Right lower quadrant pain. Negative urine pregnancy  test. LMP 05/24/2012  TRANSABDOMINAL AND TRANSVAGINAL ULTRASOUND OF PELVIS  Technique: Both transabdominal and transvaginal ultrasound  examinations of the pelvis were performed. Transabdominal technique  was performed for global imaging of the pelvis including uterus,  ovaries, adnexal regions, and pelvic cul-de-sac.  It was necessary to proceed with endovaginal exam following the  transabdominal exam to visualize the myometrium, endometrium and  adnexa.  Comparison: 2005  Findings:  Uterus: Is anteverted and anteflexed and demonstrates a sagittal  length 7.7 cm, depth of 3.1 cm and width of 4.8 cm. A homogeneous  myometrium is seen  Endometrium: Is thin and echogenic with a width of 4 mm. No areas  of focal thickening or heterogeneity are seen  and this would  correlate with a postsecretory endometrial stripe and correspond  with the provided LMP of 05/24/2012  Right ovary: Has a normal appearance measuring 1.9 x 1.2 x 2.4 cm.  Intrastromal flow is seen with color Doppler evaluation  Left ovary: Measures 1.6 x 2.3 x 1.6 cm and has a normal  appearance. Intrastromal flow is seen color Doppler evaluation  Other findings: No pelvic fluid or separate adnexal masses are seen  IMPRESSION:  Normal pelvic ultrasound.  Original Report Authenticated By: Rhodia Albright, M.D.      MAU Course  Procedures  GC/CHL culture to lab  MDM Toradol 60mg  IM ordered for pain.   17:32  Consulted with Dr. Jolayne Panther and reported PHI, lab and U/S results.  Order for CT with contrast. CT will be done between 7:30-8 20:00 care turned over to Joseph Berkshire, [place] Assessment and Plan    Kaeson Kleinert,EVE M 05/30/2012, 4:04 PM

## 2012-05-30 NOTE — MAU Provider Note (Signed)
2000 - Assumed care from [name], NP  Patient's Destiny Perry - WNL Patient had just returned from CT; awaiting results  IM Toradol had not helped pain.  IM Dilaudid given @ 2230  CT Scan *RADIOLOGY REPORT*  Clinical Data: Right-sided pain with elevated white blood cell  count. Normal pelvic ultrasound. Symptoms for several days.  Evaluate for appendicitis.   CT ABDOMEN AND PELVIS WITH CONTRAST   Technique: Multidetector CT imaging of the abdomen and pelvis was  performed following the standard protocol during bolus  administration of intravenous contrast.   Contrast: OMNIPAQUE IOHEXOL 300 MG/ML SOLN   Comparison: Pelvic ultrasound of earlier today. No prior CT.   Findings: Lung bases: Clear lung bases. Normal heart size without  pericardial or pleural effusion.   Abdomen/pelvis: Normal spleen. Underdistended proximal stomach.  Normal pancreas, gallbladder, biliary tract, adrenal glands,  kidneys. No retroperitoneal or retrocrural adenopathy.  Normal colon and terminal ileum. Appendix normal, including on  images 63-55/series 2. Normal small bowel without abdominal  ascites.   No pelvic adenopathy. Normal urinary bladder and uterus, without  adnexal mass or significant free pelvic fluid.   Bones/Musculoskeletal: No acute osseous abnormality.   IMPRESSION:  1. Normal appendix.  2. No other explanation for right-sided pain.   Original Report Authenticated By: Jeronimo Greaves, M.D.   A: Dysmenorrhea Menorrhagia  P: Discharge home Rx for ibuprofen and percocet given to patient Referral to GYN clinic sent Patient cautioned to return if her symptoms were to worsen.    Freddi Starr, PA-C 05/30/2012 8:43 PM

## 2012-05-30 NOTE — MAU Note (Signed)
Patient states she has missed some of her BCP's. Has had irregular bleeding and abdominal cramping since 12-25.

## 2012-05-31 LAB — URINE CULTURE: Colony Count: NO GROWTH

## 2012-06-01 LAB — GC/CHLAMYDIA PROBE AMP
CT Probe RNA: POSITIVE — AB
GC Probe RNA: NEGATIVE

## 2012-06-02 NOTE — MAU Provider Note (Signed)
Attestation of Attending Supervision of Advanced Practitioner (CNM/NP): Evaluation and management procedures were performed by the Advanced Practitioner under my supervision and collaboration.  I have reviewed the Advanced Practitioner's note and chart, and I agree with the management and plan.  Jaelyne Deeg 06/02/2012 9:08 AM

## 2012-06-02 NOTE — MAU Provider Note (Signed)
Attestation of Attending Supervision of Advanced Practitioner (CNM/NP): Evaluation and management procedures were performed by the Advanced Practitioner under my supervision and collaboration.  I have reviewed the Advanced Practitioner's note and chart, and I agree with the management and plan.  Aiana Nordquist 06/02/2012 9:09 AM

## 2012-06-03 ENCOUNTER — Encounter: Payer: Self-pay | Admitting: Advanced Practice Midwife

## 2012-06-06 ENCOUNTER — Encounter (HOSPITAL_COMMUNITY): Payer: Self-pay | Admitting: *Deleted

## 2012-06-27 ENCOUNTER — Encounter: Payer: Self-pay | Admitting: Advanced Practice Midwife

## 2014-02-14 ENCOUNTER — Emergency Department (HOSPITAL_COMMUNITY)
Admission: EM | Admit: 2014-02-14 | Discharge: 2014-02-14 | Disposition: A | Discharge status: Home / Self Care | Payer: Medicaid Other | Attending: Emergency Medicine | Admitting: Emergency Medicine

## 2014-02-14 ENCOUNTER — Emergency Department (HOSPITAL_COMMUNITY): Payer: Medicaid Other

## 2014-02-14 ENCOUNTER — Encounter (HOSPITAL_COMMUNITY): Payer: Self-pay | Admitting: Emergency Medicine

## 2014-02-14 DIAGNOSIS — F17200 Nicotine dependence, unspecified, uncomplicated: Secondary | ICD-10-CM | POA: Insufficient documentation

## 2014-02-14 DIAGNOSIS — N898 Other specified noninflammatory disorders of vagina: Secondary | ICD-10-CM | POA: Diagnosis not present

## 2014-02-14 DIAGNOSIS — Z3202 Encounter for pregnancy test, result negative: Secondary | ICD-10-CM | POA: Insufficient documentation

## 2014-02-14 DIAGNOSIS — R112 Nausea with vomiting, unspecified: Secondary | ICD-10-CM | POA: Diagnosis not present

## 2014-02-14 DIAGNOSIS — R109 Unspecified abdominal pain: Secondary | ICD-10-CM | POA: Diagnosis not present

## 2014-02-14 DIAGNOSIS — Z79899 Other long term (current) drug therapy: Secondary | ICD-10-CM | POA: Diagnosis not present

## 2014-02-14 DIAGNOSIS — M549 Dorsalgia, unspecified: Secondary | ICD-10-CM | POA: Insufficient documentation

## 2014-02-14 DIAGNOSIS — R6883 Chills (without fever): Secondary | ICD-10-CM | POA: Diagnosis not present

## 2014-02-14 DIAGNOSIS — R1030 Lower abdominal pain, unspecified: Secondary | ICD-10-CM

## 2014-02-14 LAB — URINALYSIS, ROUTINE W REFLEX MICROSCOPIC
BILIRUBIN URINE: NEGATIVE
Glucose, UA: NEGATIVE mg/dL
Ketones, ur: NEGATIVE mg/dL
Leukocytes, UA: NEGATIVE
Nitrite: NEGATIVE
Protein, ur: NEGATIVE mg/dL
SPECIFIC GRAVITY, URINE: 1.023 (ref 1.005–1.030)
UROBILINOGEN UA: 0.2 mg/dL (ref 0.0–1.0)
pH: 5.5 (ref 5.0–8.0)

## 2014-02-14 LAB — COMPREHENSIVE METABOLIC PANEL
ALT: 8 U/L (ref 0–35)
ANION GAP: 11 (ref 5–15)
AST: 15 U/L (ref 0–37)
Albumin: 3.6 g/dL (ref 3.5–5.2)
Alkaline Phosphatase: 49 U/L (ref 39–117)
BUN: 7 mg/dL (ref 6–23)
CO2: 24 meq/L (ref 19–32)
Calcium: 9.1 mg/dL (ref 8.4–10.5)
Chloride: 106 mEq/L (ref 96–112)
Creatinine, Ser: 0.68 mg/dL (ref 0.50–1.10)
GFR calc Af Amer: >90 mL/min (ref >90)
GFR calc non Af Amer: >90 mL/min (ref >90)
GLUCOSE: 95 mg/dL (ref 70–99)
POTASSIUM: 3.9 meq/L (ref 3.7–5.3)
Sodium: 141 mEq/L (ref 137–147)
Total Bilirubin: <0.2 mg/dL — ABNORMAL LOW (ref 0.3–1.2)
Total Protein: 6.9 g/dL (ref 6.0–8.3)

## 2014-02-14 LAB — CBC WITH DIFFERENTIAL/PLATELET
Basophils Absolute: 0.1 10*3/uL (ref 0.0–0.1)
Basophils Relative: 1 % (ref 0–1)
Eosinophils Absolute: 0.7 10*3/uL (ref 0.0–0.7)
Eosinophils Relative: 7 % — ABNORMAL HIGH (ref 0–5)
HCT: 36.9 % (ref 36.0–46.0)
Hemoglobin: 12.3 g/dL (ref 12.0–15.0)
Lymphocytes Relative: 42 % (ref 12–46)
Lymphs Abs: 4.2 10*3/uL — ABNORMAL HIGH (ref 0.7–4.0)
MCH: 34.3 pg — ABNORMAL HIGH (ref 26.0–34.0)
MCHC: 33.3 g/dL (ref 30.0–36.0)
MCV: 102.8 fL — ABNORMAL HIGH (ref 78.0–100.0)
Monocytes Absolute: 0.5 10*3/uL (ref 0.1–1.0)
Monocytes Relative: 5 % (ref 3–12)
NEUTROS ABS: 4.6 10*3/uL (ref 1.7–7.7)
NEUTROS PCT: 45 % (ref 43–77)
Platelets: 305 10*3/uL (ref 150–400)
RBC: 3.59 MIL/uL — AB (ref 3.87–5.11)
RDW: 12.3 % (ref 11.5–15.5)
WBC: 10 10*3/uL (ref 4.0–10.5)

## 2014-02-14 LAB — HIV ANTIBODY (ROUTINE TESTING W REFLEX): HIV 1&2 Ab, 4th Generation: NONREACTIVE

## 2014-02-14 LAB — WET PREP, GENITAL
Trich, Wet Prep: NONE SEEN
Yeast Wet Prep HPF POC: NONE SEEN

## 2014-02-14 LAB — URINE MICROSCOPIC-ADD ON

## 2014-02-14 LAB — POC URINE PREG, ED: Preg Test, Ur: NEGATIVE

## 2014-02-14 LAB — RPR

## 2014-02-14 MED ORDER — PROMETHAZINE HCL 25 MG PO TABS: 25.0000 mg | ORAL_TABLET | Freq: Four times a day (QID) | ORAL | Status: DC | PRN

## 2014-02-14 MED ORDER — TRAMADOL HCL 50 MG PO TABS: 50.0000 mg | ORAL_TABLET | Freq: Four times a day (QID) | ORAL | Status: DC | PRN

## 2014-02-14 MED ORDER — IOHEXOL 300 MG/ML  SOLN
100.0000 mL | Freq: Once | INTRAMUSCULAR | Status: AC | PRN
  Administered 2014-02-14: 100 mL via INTRAVENOUS

## 2014-02-14 MED ORDER — OXYCODONE-ACETAMINOPHEN 5-325 MG PO TABS
2.0000 | ORAL_TABLET | Freq: Once | ORAL | Status: AC
  Administered 2014-02-14: 2 via ORAL
  Filled 2014-02-14: qty 2

## 2014-02-14 MED ORDER — KETOROLAC TROMETHAMINE 30 MG/ML IJ SOLN
30.0000 mg | Freq: Once | INTRAMUSCULAR | Status: AC
  Administered 2014-02-14: 30 mg via INTRAVENOUS
  Filled 2014-02-14: qty 1

## 2014-02-14 MED ORDER — IOHEXOL 300 MG/ML  SOLN
50.0000 mL | Freq: Once | INTRAMUSCULAR | Status: AC | PRN
  Administered 2014-02-14: 50 mL via ORAL

## 2014-02-14 MED ORDER — HYDROMORPHONE HCL 1 MG/ML IJ SOLN
1.0000 mg | Freq: Once | INTRAMUSCULAR | Status: AC
  Administered 2014-02-14: 1 mg via INTRAVENOUS
  Filled 2014-02-14: qty 1

## 2014-02-14 NOTE — ED Notes (Signed)
Pt aware not to drink alcohol/drive/operate heavy machinery while taking prescribed medications. Given specific follow up information. Knows smoking and birth control together increases risk for blood clots. Acknowledges understanding. No other complaints/concerns/needs. Ambulatory with steady gait. Tolerated fluid challenge without adverse events/nausea/vomiting.

## 2014-02-14 NOTE — ED Notes (Signed)
Called CT asking about the delay. They reply that they will be here in a few minutes.

## 2014-02-14 NOTE — Discharge Instructions (Signed)
Abdominal Pain, Women °Abdominal (stomach, pelvic, or belly) pain can be caused by many things. It is important to tell your doctor: °· The location of the pain. °· Does it come and go or is it present all the time? °· Are there things that start the pain (eating certain foods, exercise)? °· Are there other symptoms associated with the pain (fever, nausea, vomiting, diarrhea)? °All of this is helpful to know when trying to find the cause of the pain. °CAUSES  °· Stomach: virus or bacteria infection, or ulcer. °· Intestine: appendicitis (inflamed appendix), regional ileitis (Crohn's disease), ulcerative colitis (inflamed colon), irritable bowel syndrome, diverticulitis (inflamed diverticulum of the colon), or cancer of the stomach or intestine. °· Gallbladder disease or stones in the gallbladder. °· Kidney disease, kidney stones, or infection. °· Pancreas infection or cancer. °· Fibromyalgia (pain disorder). °· Diseases of the female organs: °¨ Uterus: fibroid (non-cancerous) tumors or infection. °¨ Fallopian tubes: infection or tubal pregnancy. °¨ Ovary: cysts or tumors. °¨ Pelvic adhesions (scar tissue). °¨ Endometriosis (uterus lining tissue growing in the pelvis and on the pelvic organs). °¨ Pelvic congestion syndrome (female organs filling up with blood just before the menstrual period). °¨ Pain with the menstrual period. °¨ Pain with ovulation (producing an egg). °¨ Pain with an IUD (intrauterine device, birth control) in the uterus. °¨ Cancer of the female organs. °· Functional pain (pain not caused by a disease, may improve without treatment). °· Psychological pain. °· Depression. °DIAGNOSIS  °Your doctor will decide the seriousness of your pain by doing an examination. °· Blood tests. °· X-rays. °· Ultrasound. °· CT scan (computed tomography, special type of X-ray). °· MRI (magnetic resonance imaging). °· Cultures, for infection. °· Barium enema (dye inserted in the large intestine, to better view it with  X-rays). °· Colonoscopy (looking in intestine with a lighted tube). °· Laparoscopy (minor surgery, looking in abdomen with a lighted tube). °· Major abdominal exploratory surgery (looking in abdomen with a large incision). °TREATMENT  °The treatment will depend on the cause of the pain.  °· Many cases can be observed and treated at home. °· Over-the-counter medicines recommended by your caregiver. °· Prescription medicine. °· Antibiotics, for infection. °· Birth control pills, for painful periods or for ovulation pain. °· Hormone treatment, for endometriosis. °· Nerve blocking injections. °· Physical therapy. °· Antidepressants. °· Counseling with a psychologist or psychiatrist. °· Minor or major surgery. °HOME CARE INSTRUCTIONS  °· Do not take laxatives, unless directed by your caregiver. °· Take over-the-counter pain medicine only if ordered by your caregiver. Do not take aspirin because it can cause an upset stomach or bleeding. °· Try a clear liquid diet (broth or water) as ordered by your caregiver. Slowly move to a bland diet, as tolerated, if the pain is related to the stomach or intestine. °· Have a thermometer and take your temperature several times a day, and record it. °· Bed rest and sleep, if it helps the pain. °· Avoid sexual intercourse, if it causes pain. °· Avoid stressful situations. °· Keep your follow-up appointments and tests, as your caregiver orders. °· If the pain does not go away with medicine or surgery, you may try: °¨ Acupuncture. °¨ Relaxation exercises (yoga, meditation). °¨ Group therapy. °¨ Counseling. °SEEK MEDICAL CARE IF:  °· You notice certain foods cause stomach pain. °· Your home care treatment is not helping your pain. °· You need stronger pain medicine. °· You want your IUD removed. °· You feel faint or   lightheaded. °· You develop nausea and vomiting. °· You develop a rash. °· You are having side effects or an allergy to your medicine. °SEEK IMMEDIATE MEDICAL CARE IF:  °· Your  pain does not go away or gets worse. °· You have a fever. °· Your pain is felt only in portions of the abdomen. The right side could possibly be appendicitis. The left lower portion of the abdomen could be colitis or diverticulitis. °· You are passing blood in your stools (bright red or black tarry stools, with or without vomiting). °· You have blood in your urine. °· You develop chills, with or without a fever. °· You pass out. °MAKE SURE YOU:  °· Understand these instructions. °· Will watch your condition. °· Will get help right away if you are not doing well or get worse. °Document Released: 03/15/2007 Document Revised: 10/02/2013 Document Reviewed: 04/04/2009 °[employer]® Patient Information ©2015 [employer]. This information is not intended to replace advice given to you by your health care provider. Make sure you discuss any questions you have with your health care provider. ° °

## 2014-02-14 NOTE — ED Notes (Signed)
Pt states that she was dx w/ BV 3 wks ago.  Her doctor has still not called in the prior authorization to get her medicine.  Pt states that she has been having low abd pain that radiates to her back w/ vomiting x 2 days.  Pt states that she does not remember to take her birth control pills but has taken 3 negative preg tests.

## 2014-02-14 NOTE — ED Provider Notes (Signed)
CSN: [id number]     Arrival date & time 02/14/14  1047 History   First MD Initiated Contact with Patient 02/14/14 1137     Chief Complaint  Patient presents with  . Abdominal Pain  . Nausea  . Emesis     (Consider location/radiation/quality/duration/timing/severity/associated sxs/prior Treatment) HPI Pt is a 33yo female presenting to ED with c/o lower abdominal pain that started yesterday, associated with nausea and vomiting that started 2 days ago.  Pt states pain is sharp, cramping, dull, constant but waxes and wanes, radiates into back, 10/10 at worst.  States she was dx with BV 3 weeks ago and prescribed a topical medication as she is allergic to metronidazole, but states her PCP has still not called in the prior authorization to get her medication.  Pt states she changed from the Depo shots to OCP about 5 weeks ago.  States her menstrual cycles have never been regular but also states she forgets to take her birth control pill several times.  Reports 3 negative home pregnancy tests.  Pt was concerned for pregnancy last week as she had nausea and vomiting last week as well. Denies hx of renal stones. Denies urinary symptoms but does report hx of kidney infections.    History reviewed. No pertinent past medical history. Past Surgical History  Procedure Laterality Date  . Leep     History reviewed. No pertinent family history. History  Substance Use Topics  . Smoking status: Current Every Day Smoker  . Smokeless tobacco: Not on file  . Alcohol Use: Yes   OB History   Grav Para Term Preterm Abortions TAB SAB Ect Mult Living                 Review of Systems  Constitutional: Positive for chills. Negative for fever.  Respiratory: Negative for cough and shortness of breath.   Cardiovascular: Negative for chest pain and palpitations.  Gastrointestinal: Positive for nausea, vomiting and abdominal pain ( lower ). Negative for diarrhea and constipation.  Genitourinary: Positive for  flank pain ( bilateral) and vaginal discharge ( white). Negative for dysuria, urgency, frequency, hematuria, decreased urine volume, vaginal bleeding, vaginal pain and pelvic pain.  Musculoskeletal: Positive for back pain. Negative for myalgias.  All other systems reviewed and are negative.     Allergies  Bee venom and Metronidazole  Home Medications   Prior to Admission medications   Medication Sig Start Date End Date Taking? Authorizing Provider  acetaminophen (TYLENOL) 500 MG tablet Take 1,000 mg by mouth every 6 (six) hours as needed for mild pain.   Yes Historical Provider, MD  albuterol (PROVENTIL HFA;VENTOLIN HFA) 108 (90 BASE) MCG/ACT inhaler Inhale 2 puffs into the lungs every 6 (six) hours as needed for wheezing or shortness of breath.   Yes Historical Provider, MD  EPINEPHrine (EPIPEN) 0.3 mg/0.3 mL DEVI Inject 0.3 mLs (0.3 mg total) into the muscle once. 01/01/11  Yes Glynn Octave, MD  ibuprofen (ADVIL,MOTRIN) 200 MG tablet Take 400 mg by mouth every 6 (six) hours as needed for moderate pain.   Yes Historical Provider, MD  norethindrone-ethinyl estradiol 1/35 (ORTHO-NOVUM, NORTREL,CYCLAFEM) tablet Take 1 tablet by mouth daily.   Yes Historical Provider, MD  promethazine (PHENERGAN) 25 MG tablet Take 1 tablet (25 mg total) by mouth every 6 (six) hours as needed for nausea or vomiting. 02/14/14   Junius Finner, PA-C  traMADol (ULTRAM) 50 MG tablet Take 1 tablet (50 mg total) by mouth every 6 (six) hours as needed.  02/14/14   Junius Finner, PA-C   BP 116/76  Pulse 77  Temp(Src) 98.1 F (36.7 C) (Oral)  Resp 20  SpO2 100%  LMP 01/14/2014 Physical Exam  Nursing note and vitals reviewed. Constitutional: She appears well-developed and well-nourished. No distress.  Pt lying comfortably in exam bed, NAD.   HENT:  Head: Normocephalic and atraumatic.  Eyes: Conjunctivae are normal. No scleral icterus.  Neck: Normal range of motion.  Cardiovascular: Normal rate, regular rhythm and  normal heart sounds.   Pulmonary/Chest: Effort normal and breath sounds normal. No respiratory distress. She has no wheezes. She has no rales. She exhibits no tenderness.  Abdominal: Soft. Bowel sounds are normal. She exhibits no distension and no mass. There is tenderness ( lower abdominal tenderness, worse in suprapubic region). There is no rebound and no guarding.  Genitourinary:  Chaperoned exam External genitalia: normal Vaginal: positive discharge-white/mucous, negative bleeding Cervix: closed: positive discharge, negative bleeding. Cervical motion absent,  Adnexa palpated: negative adnexal tenderness, negative adnexal mass  Musculoskeletal: Normal range of motion.  Neurological: She is alert.  Skin: Skin is warm and dry. She is not diaphoretic.    ED Course  Procedures (including critical care time) Labs Review Labs Reviewed  WET PREP, GENITAL - Abnormal; Notable for the following:    Clue Cells Wet Prep HPF POC FEW (*)    WBC, Wet Prep HPF POC FEW (*)    All other components within normal limits  CBC WITH DIFFERENTIAL - Abnormal; Notable for the following:    RBC 3.59 (*)    MCV 102.8 (*)    MCH 34.3 (*)    Lymphs Abs 4.2 (*)    Eosinophils Relative 7 (*)    All other components within normal limits  COMPREHENSIVE METABOLIC PANEL - Abnormal; Notable for the following:    Total Bilirubin <0.2 (*)    All other components within normal limits  URINALYSIS, ROUTINE W REFLEX MICROSCOPIC - Abnormal; Notable for the following:    APPearance CLOUDY (*)    Hgb urine dipstick LARGE (*)    All other components within normal limits  URINE MICROSCOPIC-ADD ON - Abnormal; Notable for the following:    Squamous Epithelial / LPF MANY (*)    Bacteria, UA MANY (*)    All other components within normal limits  GC/CHLAMYDIA PROBE AMP  RPR  HIV ANTIBODY (ROUTINE TESTING)  POC URINE PREG, ED    Imaging Review Ct Abdomen Pelvis W Contrast  02/14/2014   CLINICAL DATA:  Lower abdominal  pain, nausea and vomiting. Blood in urine.  EXAM: CT ABDOMEN AND PELVIS WITH CONTRAST  TECHNIQUE: Multidetector CT imaging of the abdomen and pelvis was performed using the standard protocol following bolus administration of intravenous contrast.  CONTRAST:  50mL OMNIPAQUE IOHEXOL 300 MG/ML SOLN, OMNIPAQUE IOHEXOL 300 MG/ML SOLN  COMPARISON:  06/11/2012  FINDINGS: The liver, gallbladder, pancreas, spleen, adrenal glands and kidneys are within normal limits. Bowel shows no evidence of obstruction, thickening or inflammation.  No abscess or free fluid is identified. The uterus, adnexal regions and bladder are unremarkable by CT. No masses or enlarged lymph nodes are seen. No hernias are identified. Vasculature appears normal. Bony structures are unremarkable.  IMPRESSION: No acute findings in the abdomen or pelvis.   Electronically Signed   By: Irish Lack M.D.   On: 02/14/2014 15:26     EKG Interpretation None      MDM   Final diagnoses:  Lower abdominal pain  Non-intractable vomiting  with nausea, vomiting of unspecified type    Pt is a 33yo female presenting to ED with lower abdominal pain associated with nausea and vomiting. Pt is tender in lower abdomen. Pelvic exam significant for minimal white-mucous discharge.  No vaginal bleeding, CMT or adnexal tenderness. Doubt ovarian torsion or ectopic pregnancy.  Labs: minimal hematuria, pt reports hx of same w/o dx of renal stones.    12:44- repeat abdominal exam. Pt states pain is 4/10, however, pt appears quite tender to palpation in lower abdomen.  Discussed pt with Dr. Blinda Leatherwood, low concern for ovarian torsion as pain was gradual in onset and is bilateral.  Will get CT abd to r/o appendicitis vs diverticulitis vs colitis vs uterine fibroid or cyst.    CT abd: no acute findings in abdomen or pelvis.  Will tx pt symptomatically with pain an nausea medication.  Pain has improved while in ED.  Pt has been able to keep down several ounces of  PO fluids. Will discharge to f/u with PCP and [facility] within 1 week if symptoms not improving. Return precautions provided. Pt verbalized understanding and agreement with tx plan.   Junius Finner, PA-C 02/14/14 1539

## 2014-02-15 LAB — GC/CHLAMYDIA PROBE AMP
CT Probe RNA: NEGATIVE
GC Probe RNA: NEGATIVE

## 2014-02-15 NOTE — ED Provider Notes (Signed)
Medical screening examination/treatment/procedure(s) were performed by non-physician practitioner and as supervising physician I was immediately available for consultation/collaboration.   EKG Interpretation None        Gilda Crease, MD 02/15/14 [phone]

## 2014-07-30 ENCOUNTER — Emergency Department (HOSPITAL_COMMUNITY)
Admission: EM | Admit: 2014-07-30 | Discharge: 2014-07-30 | Disposition: A | Discharge status: Home / Self Care | Payer: Medicaid Other | Attending: Emergency Medicine | Admitting: Emergency Medicine

## 2014-07-30 ENCOUNTER — Encounter (HOSPITAL_COMMUNITY): Payer: Self-pay

## 2014-07-30 DIAGNOSIS — N390 Urinary tract infection, site not specified: Secondary | ICD-10-CM | POA: Insufficient documentation

## 2014-07-30 DIAGNOSIS — Z793 Long term (current) use of hormonal contraceptives: Secondary | ICD-10-CM | POA: Diagnosis not present

## 2014-07-30 DIAGNOSIS — R1030 Lower abdominal pain, unspecified: Secondary | ICD-10-CM | POA: Diagnosis present

## 2014-07-30 DIAGNOSIS — Z720 Tobacco use: Secondary | ICD-10-CM | POA: Diagnosis not present

## 2014-07-30 DIAGNOSIS — Z3202 Encounter for pregnancy test, result negative: Secondary | ICD-10-CM | POA: Insufficient documentation

## 2014-07-30 DIAGNOSIS — Z79899 Other long term (current) drug therapy: Secondary | ICD-10-CM | POA: Insufficient documentation

## 2014-07-30 DIAGNOSIS — R109 Unspecified abdominal pain: Secondary | ICD-10-CM

## 2014-07-30 LAB — URINE MICROSCOPIC-ADD ON

## 2014-07-30 LAB — CBC WITH DIFFERENTIAL/PLATELET
BASOS PCT: 0 % (ref 0–1)
Basophils Absolute: 0 10*3/uL (ref 0.0–0.1)
EOS ABS: 0.1 10*3/uL (ref 0.0–0.7)
EOS PCT: 1 % (ref 0–5)
HCT: 39.2 % (ref 36.0–46.0)
Hemoglobin: 12.9 g/dL (ref 12.0–15.0)
Lymphocytes Relative: 21 % (ref 12–46)
Lymphs Abs: 2.4 10*3/uL (ref 0.7–4.0)
MCH: 34.1 pg — ABNORMAL HIGH (ref 26.0–34.0)
MCHC: 32.9 g/dL (ref 30.0–36.0)
MCV: 103.7 fL — ABNORMAL HIGH (ref 78.0–100.0)
Monocytes Absolute: 0.9 10*3/uL (ref 0.1–1.0)
Monocytes Relative: 8 % (ref 3–12)
Neutro Abs: 8.3 10*3/uL — ABNORMAL HIGH (ref 1.7–7.7)
Neutrophils Relative %: 70 % (ref 43–77)
PLATELETS: 324 10*3/uL (ref 150–400)
RBC: 3.78 MIL/uL — ABNORMAL LOW (ref 3.87–5.11)
RDW: 12.8 % (ref 11.5–15.5)
WBC: 11.8 10*3/uL — AB (ref 4.0–10.5)

## 2014-07-30 LAB — POC URINE PREG, ED: Preg Test, Ur: NEGATIVE

## 2014-07-30 LAB — URINALYSIS, ROUTINE W REFLEX MICROSCOPIC
Bilirubin Urine: NEGATIVE
Glucose, UA: NEGATIVE mg/dL
Ketones, ur: NEGATIVE mg/dL
Nitrite: POSITIVE — AB
PROTEIN: NEGATIVE mg/dL
Specific Gravity, Urine: 1.006 (ref 1.005–1.030)
Urobilinogen, UA: 0.2 mg/dL (ref 0.0–1.0)
pH: 7 (ref 5.0–8.0)

## 2014-07-30 LAB — COMPREHENSIVE METABOLIC PANEL
ALK PHOS: 74 U/L (ref 39–117)
ALT: 9 U/L (ref 0–35)
AST: 14 U/L (ref 0–37)
Albumin: 3.9 g/dL (ref 3.5–5.2)
Anion gap: 7 (ref 5–15)
BUN: 5 mg/dL — ABNORMAL LOW (ref 6–23)
CHLORIDE: 107 mmol/L (ref 96–112)
CO2: 28 mmol/L (ref 19–32)
Calcium: 9.1 mg/dL (ref 8.4–10.5)
Creatinine, Ser: 0.66 mg/dL (ref 0.50–1.10)
GFR calc Af Amer: >90 mL/min (ref >90)
GFR calc non Af Amer: >90 mL/min (ref >90)
GLUCOSE: 95 mg/dL (ref 70–99)
Potassium: 3.4 mmol/L — ABNORMAL LOW (ref 3.5–5.1)
SODIUM: 142 mmol/L (ref 135–145)
Total Bilirubin: 0.4 mg/dL (ref 0.3–1.2)
Total Protein: 7.2 g/dL (ref 6.0–8.3)

## 2014-07-30 MED ORDER — CEPHALEXIN 500 MG PO CAPS: 500.0000 mg | ORAL_CAPSULE | Freq: Four times a day (QID) | ORAL | Status: DC

## 2014-07-30 MED ORDER — HYDROCODONE-ACETAMINOPHEN 5-325 MG PO TABS: 1.0000 | ORAL_TABLET | Freq: Four times a day (QID) | ORAL | Status: DC | PRN

## 2014-07-30 NOTE — Discharge Instructions (Signed)
Abdominal Pain °Many things can cause abdominal pain. Usually, abdominal pain is not caused by a disease and will improve without treatment. It can often be observed and treated at home. Your health care provider will do a physical exam and possibly order blood tests and X-rays to help determine the seriousness of your pain. However, in many cases, more time must pass before a clear cause of the pain can be found. Before that point, your health care provider may not know if you need more testing or further treatment. °HOME CARE INSTRUCTIONS  °Monitor your abdominal pain for any changes. The following actions may help to alleviate any discomfort you are experiencing: °· Only take over-the-counter or prescription medicines as directed by your health care provider. °· Do not take laxatives unless directed to do so by your health care provider. °· Try a clear liquid diet (broth, tea, or water) as directed by your health care provider. Slowly move to a bland diet as tolerated. °SEEK MEDICAL CARE IF: °· You have unexplained abdominal pain. °· You have abdominal pain associated with nausea or diarrhea. °· You have pain when you urinate or have a bowel movement. °· You experience abdominal pain that wakes you in the night. °· You have abdominal pain that is worsened or improved by eating food. °· You have abdominal pain that is worsened with eating fatty foods. °· You have a fever. °SEEK IMMEDIATE MEDICAL CARE IF:  °· Your pain does not go away within 2 hours. °· You keep throwing up (vomiting). °· Your pain is felt only in portions of the abdomen, such as the right side or the left lower portion of the abdomen. °· You pass bloody or black tarry stools. °MAKE SURE YOU: °· Understand these instructions.   °· Will watch your condition.   °· Will get help right away if you are not doing well or get worse.   °Document Released: 02/25/2005 Document Revised: 05/23/2013 Document Reviewed: 01/25/2013 °ExitCare® Patient Information  ©2015 [employer]. This information is not intended to replace advice given to you by your health care provider. Make sure you discuss any questions you have with your health care provider. °Urinary Tract Infection °Urinary tract infections (UTIs) can develop anywhere along your urinary tract. Your urinary tract is your body's drainage system for removing wastes and extra water. Your urinary tract includes two kidneys, two ureters, a bladder, and a urethra. Your kidneys are a pair of bean-shaped organs. Each kidney is about the size of your fist. They are located below your ribs, one on each side of your spine. °CAUSES °Infections are caused by microbes, which are microscopic organisms, including fungi, viruses, and bacteria. These organisms are so small that they can only be seen through a microscope. Bacteria are the microbes that most commonly cause UTIs. °SYMPTOMS  °Symptoms of UTIs may vary by age and gender of the patient and by the location of the infection. Symptoms in young women typically include a frequent and intense urge to urinate and a painful, burning feeling in the bladder or urethra during urination. Older women and men are more likely to be tired, shaky, and weak and have muscle aches and abdominal pain. A fever may mean the infection is in your kidneys. Other symptoms of a kidney infection include pain in your back or sides below the ribs, nausea, and vomiting. °DIAGNOSIS °To diagnose a UTI, your caregiver will ask you about your symptoms. Your caregiver also will ask to provide a urine sample. The urine sample   will be tested for bacteria and white blood cells. White blood cells are made by your body to help fight infection. °TREATMENT  °Typically, UTIs can be treated with medication. Because most UTIs are caused by a bacterial infection, they usually can be treated with the use of antibiotics. The choice of antibiotic and length of treatment depend on your symptoms and the type of bacteria  causing your infection. °HOME CARE INSTRUCTIONS °· If you were prescribed antibiotics, take them exactly as your caregiver instructs you. Finish the medication even if you feel better after you have only taken some of the medication. °· Drink enough water and fluids to keep your urine clear or pale yellow. °· Avoid caffeine, tea, and carbonated beverages. They tend to irritate your bladder. °· Empty your bladder often. Avoid holding urine for long periods of time. °· Empty your bladder before and after sexual intercourse. °· After a bowel movement, women should cleanse from front to back. Use each tissue only once. °SEEK MEDICAL CARE IF:  °· You have back pain. °· You develop a fever. °· Your symptoms do not begin to resolve within 3 days. °SEEK IMMEDIATE MEDICAL CARE IF:  °· You have severe back pain or lower abdominal pain. °· You develop chills. °· You have nausea or vomiting. °· You have continued burning or discomfort with urination. °MAKE SURE YOU:  °· Understand these instructions. °· Will watch your condition. °· Will get help right away if you are not doing well or get worse. °Document Released: 02/25/2005 Document Revised: 11/17/2011 Document Reviewed: 06/26/2011 °ExitCare® Patient Information ©2015 [employer]. This information is not intended to replace advice given to you by your health care provider. Make sure you discuss any questions you have with your health care provider. ° °

## 2014-07-30 NOTE — ED Notes (Signed)
Pt. Resting quietly in bed. Blankets provided for comfort. Call bell within reach. Wheels locked. Bed in low position. Will continue to monitor.

## 2014-07-30 NOTE — ED Notes (Signed)
Pt states since last night she has a knot in rt lower abdomen and radiating to back.  Denies n/v/d.  Pt states painful with urination and potentially increased in frequency. States she feels hot but hasn't checked temp at home.  No vaginal discharge.

## 2014-07-30 NOTE — ED Provider Notes (Signed)
CSN: [id number]     Arrival date & time 07/30/14  4098 History   First MD Initiated Contact with Patient 07/30/14 1126     Chief Complaint  Patient presents with  . Abdominal Pain     (Consider location/radiation/quality/duration/timing/severity/associated sxs/prior Treatment) HPI Comments: Patient presents to the emergency department with chief complaint of lower abdominal pain. She states the pain started 2-3 days ago, but worsened last night. She states that is also somewhat on the right side and radiates to her back. She denies any nausea, vomiting, or diarrhea. She does report dysuria, and urinary frequency. She denies any measured fevers. Denies any vaginal discharge or bleeding.  The history is provided by the patient. No language interpreter was used.    History reviewed. No pertinent past medical history. Past Surgical History  Procedure Laterality Date  . Leep     History reviewed. No pertinent family history. History  Substance Use Topics  . Smoking status: Current Every Day Smoker  . Smokeless tobacco: Not on file  . Alcohol Use: Yes   OB History    No data available     Review of Systems  Constitutional: Negative for fever and chills.  Respiratory: Negative for shortness of breath.   Cardiovascular: Negative for chest pain.  Gastrointestinal: Positive for abdominal pain. Negative for nausea, vomiting, diarrhea and constipation.  Genitourinary: Positive for dysuria and frequency.  All other systems reviewed and are negative.     Allergies  Bee venom and Metronidazole  Home Medications   Prior to Admission medications   Medication Sig Start Date End Date Taking? Authorizing Provider  acetaminophen (TYLENOL) 500 MG tablet Take 1,000 mg by mouth every 6 (six) hours as needed for mild pain.    Historical Provider, MD  albuterol (PROVENTIL HFA;VENTOLIN HFA) 108 (90 BASE) MCG/ACT inhaler Inhale 2 puffs into the lungs every 6 (six) hours as needed for wheezing  or shortness of breath.    Historical Provider, MD  EPINEPHrine (EPIPEN) 0.3 mg/0.3 mL DEVI Inject 0.3 mLs (0.3 mg total) into the muscle once. 01/01/11   Glynn Octave, MD  ibuprofen (ADVIL,MOTRIN) 200 MG tablet Take 400 mg by mouth every 6 (six) hours as needed for moderate pain.    Historical Provider, MD  norethindrone-ethinyl estradiol 1/35 (ORTHO-NOVUM, NORTREL,CYCLAFEM) tablet Take 1 tablet by mouth daily.    Historical Provider, MD  promethazine (PHENERGAN) 25 MG tablet Take 1 tablet (25 mg total) by mouth every 6 (six) hours as needed for nausea or vomiting. 02/14/14   Junius Finner, PA-C  traMADol (ULTRAM) 50 MG tablet Take 1 tablet (50 mg total) by mouth every 6 (six) hours as needed. 02/14/14   Junius Finner, PA-C   BP 108/67 mmHg  Pulse 84  Temp(Src) 98.7 F (37.1 C) (Oral)  Resp 16  SpO2 100%  LMP 07/23/2014 Physical Exam  Constitutional: She is oriented to person, place, and time. She appears well-developed and well-nourished.  HENT:  Head: Normocephalic and atraumatic.  Eyes: Conjunctivae and EOM are normal. Pupils are equal, round, and reactive to light.  Neck: Normal range of motion. Neck supple.  Cardiovascular: Normal rate and regular rhythm.  Exam reveals no gallop and no friction rub.   No murmur heard. Pulmonary/Chest: Effort normal and breath sounds normal. No respiratory distress. She has no wheezes. She has no rales. She exhibits no tenderness.  Abdominal: Soft. Bowel sounds are normal. She exhibits no distension and no mass. There is tenderness. There is no rebound and no guarding.  Suprapubic abdominal tenderness, no significant tenderness to palpation at McBurney's point, no other focal abdominal tenderness  Musculoskeletal: Normal range of motion. She exhibits no edema or tenderness.  Neurological: She is alert and oriented to person, place, and time.  Skin: Skin is warm and dry.  Psychiatric: She has a normal mood and affect. Her behavior is normal. Judgment  and thought content normal.  Nursing note and vitals reviewed.   ED Course  Procedures (including critical care time) Results for orders placed or performed during the hospital encounter of 07/30/14  CBC with Differential  Result Value Ref Range   WBC 11.8 (H) 4.0 - 10.5 K/uL   RBC 3.78 (L) 3.87 - 5.11 MIL/uL   Hemoglobin 12.9 12.0 - 15.0 g/dL   HCT 16.139.2 09.636.0 - 04.546.0 %   MCV 103.7 (H) 78.0 - 100.0 fL   MCH 34.1 (H) 26.0 - 34.0 pg   MCHC 32.9 30.0 - 36.0 g/dL   RDW 40.912.8 81.111.5 - 91.415.5 %   Platelets 324 150 - 400 K/uL   Neutrophils Relative % 70 43 - 77 %   Neutro Abs 8.3 (H) 1.7 - 7.7 K/uL   Lymphocytes Relative 21 12 - 46 %   Lymphs Abs 2.4 0.7 - 4.0 K/uL   Monocytes Relative 8 3 - 12 %   Monocytes Absolute 0.9 0.1 - 1.0 K/uL   Eosinophils Relative 1 0 - 5 %   Eosinophils Absolute 0.1 0.0 - 0.7 K/uL   Basophils Relative 0 0 - 1 %   Basophils Absolute 0.0 0.0 - 0.1 K/uL  Comprehensive metabolic panel  Result Value Ref Range   Sodium 142 135 - 145 mmol/L   Potassium 3.4 (L) 3.5 - 5.1 mmol/L   Chloride 107 96 - 112 mmol/L   CO2 28 19 - 32 mmol/L   Glucose, Bld 95 70 - 99 mg/dL   BUN 5 (L) 6 - 23 mg/dL   Creatinine, Ser 7.820.66 0.50 - 1.10 mg/dL   Calcium 9.1 8.4 - 95.610.5 mg/dL   Total Protein 7.2 6.0 - 8.3 g/dL   Albumin 3.9 3.5 - 5.2 g/dL   AST 14 0 - 37 U/L   ALT 9 0 - 35 U/L   Alkaline Phosphatase 74 39 - 117 U/L   Total Bilirubin 0.4 0.3 - 1.2 mg/dL   GFR calc non Af Amer >90 >90 mL/min   GFR calc Af Amer >90 >90 mL/min   Anion gap 7 5 - 15  Urinalysis, Routine w reflex microscopic  Result Value Ref Range   Color, Urine YELLOW YELLOW   APPearance CLOUDY (A) CLEAR   Specific Gravity, Urine 1.006 1.005 - 1.030   pH 7.0 5.0 - 8.0   Glucose, UA NEGATIVE NEGATIVE mg/dL   Hgb urine dipstick LARGE (A) NEGATIVE   Bilirubin Urine NEGATIVE NEGATIVE   Ketones, ur NEGATIVE NEGATIVE mg/dL   Protein, ur NEGATIVE NEGATIVE mg/dL   Urobilinogen, UA 0.2 0.0 - 1.0 mg/dL   Nitrite  POSITIVE (A) NEGATIVE   Leukocytes, UA MODERATE (A) NEGATIVE  Urine microscopic-add on  Result Value Ref Range   Squamous Epithelial / LPF MANY (A) RARE   WBC, UA TOO NUMEROUS TO COUNT <3 WBC/hpf   RBC / HPF 3-6 <3 RBC/hpf   Bacteria, UA MANY (A) RARE  POC Urine Pregnancy, ED ([facility] NOT [facility] at [facility])  Result Value Ref Range   Preg Test, Ur NEGATIVE NEGATIVE   No results found.   Imaging Review No results found.  EKG Interpretation None      MDM   Final diagnoses:  UTI (lower urinary tract infection)  Abdominal pain, unspecified abdominal location    Patient with suprapubic abdominal tenderness, and urinalysis remarkable  UTI. Will treat with Keflex and recommend primary care follow-up. Abdominal return precautions given.  Discussed with patient that I cannot entirely rule out appendicitis, however I think this is much less likely given suprapubic tenderness and urinalysis results. Patient agrees to follow-up if symptoms worsen.  Patient instructed to return for:  New or worsening symptoms, including, increased abdominal pain, especially pain that localizes to one side, bloody vomit, bloody diarrhea, fever >101, and intractable vomiting.     Roxy Horseman, PA-C 07/30/14 1153  Toy Cookey, MD 07/31/14 [phone]

## 2014-08-14 ENCOUNTER — Encounter (HOSPITAL_COMMUNITY): Payer: Self-pay

## 2014-08-14 ENCOUNTER — Emergency Department (HOSPITAL_COMMUNITY)
Admission: EM | Admit: 2014-08-14 | Discharge: 2014-08-14 | Discharge status: Against Medical Advice | Payer: Medicaid Other | Attending: Emergency Medicine | Admitting: Emergency Medicine

## 2014-08-14 DIAGNOSIS — R1031 Right lower quadrant pain: Secondary | ICD-10-CM | POA: Diagnosis present

## 2014-08-14 DIAGNOSIS — Z720 Tobacco use: Secondary | ICD-10-CM | POA: Diagnosis not present

## 2014-08-14 NOTE — ED Notes (Signed)
Attempted to call Pt w/o response.  

## 2014-08-14 NOTE — ED Notes (Signed)
I called patient name to collect labs but no answer

## 2014-08-14 NOTE — ED Notes (Signed)
Attempted to call Pt.  It is assumed that she left.

## 2014-08-14 NOTE — ED Notes (Signed)
Pt c/o intermittent sharp RLQ pain and dysuria x 2 days.  Pain score 10/10.  Pt reports that she was recently seen and diagnosed w/ an UTI.  Sts she misplaced her prescribed medications.

## 2014-10-21 ENCOUNTER — Emergency Department (HOSPITAL_COMMUNITY): Payer: Medicaid Other

## 2014-10-21 ENCOUNTER — Encounter (HOSPITAL_COMMUNITY): Payer: Self-pay | Admitting: Emergency Medicine

## 2014-10-21 ENCOUNTER — Emergency Department (HOSPITAL_COMMUNITY)
Admission: EM | Admit: 2014-10-21 | Discharge: 2014-10-21 | Disposition: A | Discharge status: Home / Self Care | Payer: Medicaid Other | Attending: Emergency Medicine | Admitting: Emergency Medicine

## 2014-10-21 DIAGNOSIS — Y9289 Other specified places as the place of occurrence of the external cause: Secondary | ICD-10-CM | POA: Insufficient documentation

## 2014-10-21 DIAGNOSIS — S1093XA Contusion of unspecified part of neck, initial encounter: Secondary | ICD-10-CM | POA: Insufficient documentation

## 2014-10-21 DIAGNOSIS — Z3202 Encounter for pregnancy test, result negative: Secondary | ICD-10-CM | POA: Diagnosis not present

## 2014-10-21 DIAGNOSIS — Z720 Tobacco use: Secondary | ICD-10-CM | POA: Diagnosis not present

## 2014-10-21 DIAGNOSIS — S300XXA Contusion of lower back and pelvis, initial encounter: Secondary | ICD-10-CM

## 2014-10-21 DIAGNOSIS — Y9389 Activity, other specified: Secondary | ICD-10-CM | POA: Diagnosis not present

## 2014-10-21 DIAGNOSIS — Z792 Long term (current) use of antibiotics: Secondary | ICD-10-CM | POA: Diagnosis not present

## 2014-10-21 DIAGNOSIS — Y998 Other external cause status: Secondary | ICD-10-CM | POA: Diagnosis not present

## 2014-10-21 DIAGNOSIS — Z79899 Other long term (current) drug therapy: Secondary | ICD-10-CM | POA: Insufficient documentation

## 2014-10-21 DIAGNOSIS — S3993XA Unspecified injury of pelvis, initial encounter: Secondary | ICD-10-CM | POA: Diagnosis present

## 2014-10-21 LAB — POC URINE PREG, ED: Preg Test, Ur: NEGATIVE

## 2014-10-21 MED ORDER — HYDROCODONE-ACETAMINOPHEN 5-325 MG PO TABS: 2.0000 | ORAL_TABLET | ORAL | Status: DC | PRN

## 2014-10-21 MED ORDER — IBUPROFEN 800 MG PO TABS: 800.0000 mg | ORAL_TABLET | Freq: Three times a day (TID) | ORAL | Status: DC

## 2014-10-21 MED ORDER — HYDROCODONE-ACETAMINOPHEN 5-325 MG PO TABS
2.0000 | ORAL_TABLET | Freq: Once | ORAL | Status: AC
  Administered 2014-10-21: 2 via ORAL
  Filled 2014-10-21: qty 2
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
## 2014-10-21 NOTE — Discharge Instructions (Signed)
Contusion °A contusion is a deep bruise. Contusions are the result of an injury that caused bleeding under the skin. The contusion may turn blue, purple, or yellow. Minor injuries will give you a painless contusion, but more severe contusions may stay painful and swollen for a few weeks.  °CAUSES  °A contusion is usually caused by a blow, trauma, or direct force to an area of the body. °SYMPTOMS  °· Swelling and redness of the injured area. °· Bruising of the injured area. °· Tenderness and soreness of the injured area. °· Pain. °DIAGNOSIS  °The diagnosis can be made by taking a history and physical exam. An X-ray, CT scan, or MRI may be needed to determine if there were any associated injuries, such as fractures. °TREATMENT  °Specific treatment will depend on what area of the body was injured. In general, the best treatment for a contusion is resting, icing, elevating, and applying cold compresses to the injured area. Over-the-counter medicines may also be recommended for pain control. Ask your caregiver what the best treatment is for your contusion. °HOME CARE INSTRUCTIONS  °· Put ice on the injured area. °¨ Put ice in a plastic bag. °¨ Place a towel between your skin and the bag. °¨ Leave the ice on for 15-20 minutes, 3-4 times a day, or as directed by your health care provider. °· Only take over-the-counter or prescription medicines for pain, discomfort, or fever as directed by your caregiver. Your caregiver may recommend avoiding anti-inflammatory medicines (aspirin, ibuprofen, and naproxen) for 48 hours because these medicines may increase bruising. °· Rest the injured area. °· If possible, elevate the injured area to reduce swelling. °SEEK IMMEDIATE MEDICAL CARE IF:  °· You have increased bruising or swelling. °· You have pain that is getting worse. °· Your swelling or pain is not relieved with medicines. °MAKE SURE YOU:  °· Understand these instructions. °· Will watch your condition. °· Will get help right  away if you are not doing well or get worse. °Document Released: 02/25/2005 Document Revised: 05/23/2013 Document Reviewed: 03/23/2011 °Exit[employer]® Patient Information ©2015 [employer]. This information is not intended to replace advice given to you by your health care provider. Make sure you discuss any questions you have with your health care provider. ° °
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
## 2014-11-24 ENCOUNTER — Emergency Department (HOSPITAL_COMMUNITY)
Admission: EM | Admit: 2014-11-24 | Discharge: 2014-11-24 | Disposition: A | Discharge status: Home / Self Care | Payer: Medicaid Other | Attending: Emergency Medicine | Admitting: Emergency Medicine

## 2014-11-24 ENCOUNTER — Emergency Department (HOSPITAL_COMMUNITY): Payer: Medicaid Other

## 2014-11-24 ENCOUNTER — Encounter (HOSPITAL_COMMUNITY): Payer: Self-pay | Admitting: *Deleted

## 2014-11-24 DIAGNOSIS — Y9289 Other specified places as the place of occurrence of the external cause: Secondary | ICD-10-CM | POA: Diagnosis not present

## 2014-11-24 DIAGNOSIS — X58XXXA Exposure to other specified factors, initial encounter: Secondary | ICD-10-CM | POA: Diagnosis not present

## 2014-11-24 DIAGNOSIS — Z79899 Other long term (current) drug therapy: Secondary | ICD-10-CM | POA: Insufficient documentation

## 2014-11-24 DIAGNOSIS — S93602A Unspecified sprain of left foot, initial encounter: Secondary | ICD-10-CM

## 2014-11-24 DIAGNOSIS — Y9389 Activity, other specified: Secondary | ICD-10-CM | POA: Diagnosis not present

## 2014-11-24 DIAGNOSIS — Z720 Tobacco use: Secondary | ICD-10-CM | POA: Diagnosis not present

## 2014-11-24 DIAGNOSIS — S99922A Unspecified injury of left foot, initial encounter: Secondary | ICD-10-CM | POA: Diagnosis present

## 2014-11-24 DIAGNOSIS — Y998 Other external cause status: Secondary | ICD-10-CM | POA: Diagnosis not present

## 2014-11-24 MED ORDER — IBUPROFEN 800 MG PO TABS: 800.0000 mg | ORAL_TABLET | Freq: Three times a day (TID) | ORAL | Status: DC | PRN

## 2014-11-24 MED ORDER — HYDROCODONE-ACETAMINOPHEN 5-325 MG PO TABS
1.0000 | ORAL_TABLET | Freq: Four times a day (QID) | ORAL | Status: DC | PRN
Start: 2014-11-24 — End: 2015-04-03

## 2014-11-24 MED ORDER — HYDROCODONE-ACETAMINOPHEN 5-325 MG PO TABS
1.0000 | ORAL_TABLET | Freq: Once | ORAL | Status: AC
  Administered 2014-11-24: 1 via ORAL
  Filled 2014-11-24: qty 1

## 2014-11-24 NOTE — Discharge Instructions (Signed)
Return here as needed.  Follow-up with the orthopedist provided.  Ice and elevate your foot and ankle

## 2014-11-24 NOTE — ED Notes (Signed)
Pt reports she stepped into a drain and turned her foot . Pt now has pain to lateral Lt foot.

## 2014-11-24 NOTE — ED Notes (Signed)
Declined W/C at D/C and was escorted to lobby by RN. 

## 2014-11-24 NOTE — ED Notes (Signed)
Pt provided with warm blanket per request °

## 2014-11-24 NOTE — ED Provider Notes (Signed)
CSN: [id number]     Arrival date & time 11/24/14  1203 History  This chart was scribed for non-physician practitioner, Ebbie Ridge, PA-C,working with Benjiman Core, MD, by Karle Plumber, ED Scribe. This patient was seen in room TR08C/TR08C and the patient's care was started at 12:39 PM.  Chief Complaint  Patient presents with  . Foot Pain   The history is provided by the patient and medical records. No language interpreter was used.    HPI Comments:  Destiny Perry is a 34 y.o. female who presents to the Emergency Department complaining of severe left foot pain that began PTA. She reports she was walking and stepped into a drain turning her foot on to the lateral side. She has not done anything to treat her pain. Bearing weight makes the pain worse. She denies alleviating factors. She denies numbness, tingling or weakness of the left foot or LLE, bruising, wounds, nausea or vomiting.  History reviewed. No pertinent past medical history. Past Surgical History  Procedure Laterality Date  . Leep     History reviewed. No pertinent family history. History  Substance Use Topics  . Smoking status: Current Every Day Smoker -- 1.00 packs/day    Types: Cigarettes  . Smokeless tobacco: Not on file  . Alcohol Use: No   OB History    No data available     Review of Systems  Gastrointestinal: Negative for nausea and vomiting.  Musculoskeletal: Positive for arthralgias.  Skin: Negative for color change and wound.  Neurological: Negative for weakness and numbness.    Allergies  Bee venom and Metronidazole  Home Medications   Prior to Admission medications   Medication Sig Start Date End Date Taking? Authorizing Provider  acetaminophen (TYLENOL) 500 MG tablet Take 1,000 mg by mouth every 6 (six) hours as needed for mild pain.    Historical Provider, MD  albuterol (PROVENTIL HFA;VENTOLIN HFA) 108 (90 BASE) MCG/ACT inhaler Inhale 2 puffs into the lungs every 6 (six) hours as needed  for wheezing or shortness of breath.    Historical Provider, MD  cephALEXin (KEFLEX) 500 MG capsule Take 1 capsule (500 mg total) by mouth 4 (four) times daily. Patient not taking: Reported on 10/21/2014 07/30/14   Roxy Horseman, PA-C  EPINEPHrine (EPIPEN) 0.3 mg/0.3 mL DEVI Inject 0.3 mLs (0.3 mg total) into the muscle once. 01/01/11   Glynn Octave, MD  HYDROcodone-acetaminophen (NORCO/VICODIN) 5-325 MG per tablet Take 2 tablets by mouth every 4 (four) hours as needed. 10/21/14   Elson Areas, PA-C  ibuprofen (ADVIL,MOTRIN) 800 MG tablet Take 1 tablet (800 mg total) by mouth 3 (three) times daily. 10/21/14   Elson Areas, PA-C  promethazine (PHENERGAN) 25 MG tablet Take 1 tablet (25 mg total) by mouth every 6 (six) hours as needed for nausea or vomiting. Patient not taking: Reported on 10/21/2014 02/14/14   Junius Finner, PA-C  traMADol (ULTRAM) 50 MG tablet Take 1 tablet (50 mg total) by mouth every 6 (six) hours as needed. Patient not taking: Reported on 10/21/2014 02/14/14   Junius Finner, PA-C   Triage Vitals: BP 116/68 mmHg  Pulse 72  Temp(Src) 97.7 F (36.5 C) (Oral)  Resp 14  SpO2 97% Physical Exam  Constitutional: She is oriented to person, place, and time. She appears well-developed and well-nourished.  HENT:  Head: Normocephalic and atraumatic.  Eyes: EOM are normal.  Neck: Normal range of motion.  Cardiovascular: Normal rate.   Pulmonary/Chest: Effort normal.  Musculoskeletal: Normal range of motion.  She exhibits tenderness.  Tenderness to palpation to lateral aspect of left foot. No swelling, bruising or obvious deformity.  Neurological: She is alert and oriented to person, place, and time.  Skin: Skin is warm and dry.  Psychiatric: She has a normal mood and affect. Her behavior is normal.  Nursing note and vitals reviewed.   ED Course  Procedures (including critical care time) DIAGNOSTIC STUDIES: Oxygen Saturation is 97% on RA, normal by my interpretation.    COORDINATION OF CARE: 12:41 PM- Will X-Ray left foot and ankle. Pt verbalizes understanding and agrees to plan.   Imaging Review Dg Ankle Complete Left  11/24/2014   CLINICAL DATA:  Acute ankle and foot pain.  EXAM: LEFT ANKLE COMPLETE - 3+ VIEW  COMPARISON:  11/24/2014  FINDINGS: There is no evidence of fracture, dislocation, or joint effusion. There is no evidence of arthropathy or other focal bone abnormality. Soft tissues are unremarkable. Incidental sclerotic bone island in the calcaneus noted.  IMPRESSION: No acute osseous finding.   Electronically Signed   By: Judie Petit.  Shick M.D.   On: 11/24/2014 13:27   Dg Foot Complete Left  11/24/2014   CLINICAL DATA:  Foot pain  EXAM: LEFT FOOT - COMPLETE 3+ VIEW  COMPARISON:  None  FINDINGS: There is no evidence of fracture or dislocation. There is no evidence of arthropathy or other focal bone abnormality. Soft tissues are unremarkable.  IMPRESSION: Negative.   Electronically Signed   By: Signa Kell M.D.   On: 11/24/2014 13:28     I personally performed the services described in this documentation, which was scribed in my presence. The recorded information has been reviewed and is accurate.    Charlestine Night, PA-C 11/25/14 7510  Benjiman Core, MD 11/25/14 [id number]

## 2015-04-03 ENCOUNTER — Inpatient Hospital Stay (HOSPITAL_COMMUNITY): Payer: Medicaid Other

## 2015-04-03 ENCOUNTER — Encounter (HOSPITAL_COMMUNITY): Payer: Self-pay

## 2015-04-03 ENCOUNTER — Inpatient Hospital Stay (HOSPITAL_COMMUNITY)
Admission: AD | Admit: 2015-04-03 | Discharge: 2015-04-03 | Disposition: A | Discharge status: Home / Self Care | Payer: Medicaid Other | Source: Ambulatory Visit | Attending: Obstetrics and Gynecology | Admitting: Obstetrics and Gynecology

## 2015-04-03 DIAGNOSIS — F17210 Nicotine dependence, cigarettes, uncomplicated: Secondary | ICD-10-CM | POA: Diagnosis not present

## 2015-04-03 DIAGNOSIS — R109 Unspecified abdominal pain: Secondary | ICD-10-CM | POA: Diagnosis present

## 2015-04-03 DIAGNOSIS — N72 Inflammatory disease of cervix uteri: Secondary | ICD-10-CM | POA: Insufficient documentation

## 2015-04-03 DIAGNOSIS — R1031 Right lower quadrant pain: Secondary | ICD-10-CM | POA: Diagnosis not present

## 2015-04-03 LAB — CBC
HCT: 37.6 % (ref 36.0–46.0)
HEMOGLOBIN: 12.7 g/dL (ref 12.0–15.0)
MCH: 33.6 pg (ref 26.0–34.0)
MCHC: 33.8 g/dL (ref 30.0–36.0)
MCV: 99.5 fL (ref 78.0–100.0)
Platelets: 316 10*3/uL (ref 150–400)
RBC: 3.78 MIL/uL — ABNORMAL LOW (ref 3.87–5.11)
RDW: 12.5 % (ref 11.5–15.5)
WBC: 9 10*3/uL (ref 4.0–10.5)

## 2015-04-03 LAB — URINE MICROSCOPIC-ADD ON

## 2015-04-03 LAB — URINALYSIS, ROUTINE W REFLEX MICROSCOPIC
Bilirubin Urine: NEGATIVE
GLUCOSE, UA: NEGATIVE mg/dL
Ketones, ur: NEGATIVE mg/dL
LEUKOCYTES UA: NEGATIVE
Nitrite: NEGATIVE
Protein, ur: NEGATIVE mg/dL
UROBILINOGEN UA: 0.2 mg/dL (ref 0.0–1.0)
pH: 5.5 (ref 5.0–8.0)

## 2015-04-03 LAB — POCT PREGNANCY, URINE: PREG TEST UR: NEGATIVE

## 2015-04-03 LAB — WET PREP, GENITAL
TRICH WET PREP: NONE SEEN
Yeast Wet Prep HPF POC: NONE SEEN

## 2015-04-03 MED ORDER — CEFTRIAXONE SODIUM 250 MG IJ SOLR
250.0000 mg | Freq: Once | INTRAMUSCULAR | Status: AC
  Administered 2015-04-03: 250 mg via INTRAMUSCULAR
  Filled 2015-04-03: qty 250

## 2015-04-03 MED ORDER — KETOROLAC TROMETHAMINE 60 MG/2ML IM SOLN
60.0000 mg | Freq: Once | INTRAMUSCULAR | Status: AC
  Administered 2015-04-03: 60 mg via INTRAMUSCULAR
  Filled 2015-04-03: qty 2

## 2015-04-03 MED ORDER — DOXYCYCLINE HYCLATE 100 MG PO CAPS: 100.0000 mg | ORAL_CAPSULE | Freq: Two times a day (BID) | ORAL | Status: DC

## 2015-04-03 NOTE — Discharge Instructions (Signed)
Cervicitis °Cervicitis is a soreness and swelling (inflammation) of the cervix. Your cervix is located at the bottom of your uterus. It opens up to the vagina. °CAUSES  °· Sexually transmitted infections (STIs).   °· Allergic reaction.   °· Medicines or birth control devices that are put in the vagina.   °· Injury to the cervix.   °· Bacterial infections.   °RISK FACTORS °You are at greater risk if you: °· Have unprotected sexual intercourse. °· Have sexual intercourse with many partners. °· Began sexual intercourse at an early age. °· Have a history of STIs. °SYMPTOMS  °There may be no symptoms. If symptoms occur, they may include:  °· Gray, white, yellow, or bad-smelling vaginal discharge.   °· Pain or itching of the area outside the vagina.   °· Painful sexual intercourse.   °· Lower abdominal or lower back pain, especially during intercourse.   °· Frequent urination.   °· Abnormal vaginal bleeding between periods, after sexual intercourse, or after menopause.   °· Pressure or a heavy feeling in the pelvis.   °DIAGNOSIS  °Diagnosis is made after a pelvic exam. Other tests may include:  °· Examination of any discharge under a microscope (wet prep).   °· A Pap test.   °TREATMENT  °Treatment will depend on the cause of cervicitis. If it is caused by an STI, both you and your partner will need to be treated. Antibiotic medicines will be given.  °HOME CARE INSTRUCTIONS  °· Do not have sexual intercourse until your health care provider says it is okay.   °· Do not have sexual intercourse until your partner has been treated, if your cervicitis is caused by an STI.   °· Take your antibiotics as directed. Finish them even if you start to feel better.   °SEEK MEDICAL CARE IF: °· Your symptoms come back.   °· You have a fever.   °MAKE SURE YOU:  °· Understand these instructions. °· Will watch your condition. °· Will get help right away if you are not doing well or get worse. °  °This information is not intended to replace  advice given to you by your health care provider. Make sure you discuss any questions you have with your health care provider. °  °Document Released: 05/18/2005 Document Revised: 05/23/2013 Document Reviewed: 11/09/2012 °Elsevier Interactive Patient Education ©2016 [employer] ° °

## 2015-04-03 NOTE — MAU Provider Note (Signed)
History     CSN: [id number]  Arrival date and time: 04/03/15 4098   First Provider Initiated Contact with Patient 04/03/15 2035      No chief complaint on file.  HPI Comments: LMP 03/14/15. States that it was lighter than normal, but periods are usually regular.   Abdominal Pain This is a new problem. The current episode started yesterday. The onset quality is gradual. The problem occurs constantly. The pain is located in the RLQ. The pain is at a severity of 8/10. The quality of the pain is sharp and cramping. The abdominal pain does not radiate. Pertinent negatives include no constipation, diarrhea, dysuria, fever, frequency, nausea or vomiting. Nothing aggravates the pain. The pain is relieved by nothing. She has tried acetaminophen (NSAIDs ) for the symptoms. The treatment provided no relief.    History reviewed. No pertinent past medical history.  Past Surgical History  Procedure Laterality Date  . Leep    . Leap procedure    . Wisdom tooth extraction      History reviewed. No pertinent family history.  Social History  Substance Use Topics  . Smoking status: Current Every Day Smoker -- 1.00 packs/day    Types: Cigarettes  . Smokeless tobacco: None  . Alcohol Use: No    Allergies:  Allergies  Allergen Reactions  . Bee Venom Anaphylaxis  . Metronidazole Hives    Prescriptions prior to admission  Medication Sig Dispense Refill Last Dose  . acetaminophen (TYLENOL) 500 MG tablet Take 1,000 mg by mouth every 6 (six) hours as needed for mild pain.   unknown  . albuterol (PROVENTIL HFA;VENTOLIN HFA) 108 (90 BASE) MCG/ACT inhaler Inhale 2 puffs into the lungs every 6 (six) hours as needed for wheezing or shortness of breath.   unknown  . cephALEXin (KEFLEX) 500 MG capsule Take 1 capsule (500 mg total) by mouth 4 (four) times daily. (Patient not taking: Reported on 10/21/2014) 40 capsule 0 Completed Course at Unknown time  . EPINEPHrine (EPIPEN) 0.3 mg/0.3 mL DEVI Inject  0.3 mLs (0.3 mg total) into the muscle once. 1 Device 0 unknown  . HYDROcodone-acetaminophen (NORCO/VICODIN) 5-325 MG per tablet Take 1 tablet by mouth every 6 (six) hours as needed for moderate pain. 15 tablet 0   . ibuprofen (ADVIL,MOTRIN) 800 MG tablet Take 1 tablet (800 mg total) by mouth every 8 (eight) hours as needed. 21 tablet 0   . promethazine (PHENERGAN) 25 MG tablet Take 1 tablet (25 mg total) by mouth every 6 (six) hours as needed for nausea or vomiting. (Patient not taking: Reported on 10/21/2014) 10 tablet 0 Completed Course at Unknown time  . traMADol (ULTRAM) 50 MG tablet Take 1 tablet (50 mg total) by mouth every 6 (six) hours as needed. (Patient not taking: Reported on 10/21/2014) 15 tablet 0 Completed Course at Unknown time    Review of Systems  Constitutional: Negative for fever.  Gastrointestinal: Positive for abdominal pain. Negative for nausea, vomiting, diarrhea and constipation.  Genitourinary: Negative for dysuria, urgency and frequency.   Physical Exam   Blood pressure 102/69, pulse 102, temperature 98.5 F (36.9 C), temperature source Oral, resp. rate 16, height  (1.6 m), weight 53.071 kg (117 lb), last menstrual period 03/16/2015, SpO2 100 %.  Physical Exam  Nursing note and vitals reviewed. Constitutional: She is oriented to person, place, and time. She appears well-developed and well-nourished. No distress.  HENT:  Head: [id number]: Normal rate.   Respiratory: Effort normal.  GI: Soft.  There is no tenderness. There is no rebound.  Genitourinary:   External: no lesion Vagina: small amount of white discharge Cervix: pink, smooth, +CMT Uterus: NSSC Adnexa: NT   Neurological: She is alert and oriented to person, place, and time.  Skin: Skin is warm and dry.  Psychiatric: She has a normal mood and affect.   Results for orders placed or performed during the hospital encounter of 04/03/15 (from the past 24 hour(s))  Urinalysis,  Routine w reflex microscopic (not at [facility])     Status: Abnormal   Collection Time: 04/03/15  8:20 PM  Result Value Ref Range   Color, Urine YELLOW YELLOW   APPearance CLEAR CLEAR   Specific Gravity, Urine >1.030 (H) 1.005 - 1.030   pH 5.5 5.0 - 8.0   Glucose, UA NEGATIVE NEGATIVE mg/dL   Hgb urine dipstick LARGE (A) NEGATIVE   Bilirubin Urine NEGATIVE NEGATIVE   Ketones, ur NEGATIVE NEGATIVE mg/dL   Protein, ur NEGATIVE NEGATIVE mg/dL   Urobilinogen, UA 0.2 0.0 - 1.0 mg/dL   Nitrite NEGATIVE NEGATIVE   Leukocytes, UA NEGATIVE NEGATIVE  Urine microscopic-add on     Status: Abnormal   Collection Time: 04/03/15  8:20 PM  Result Value Ref Range   Squamous Epithelial / LPF FEW (A) RARE   WBC, UA 3-6 <3 WBC/hpf   RBC / HPF 0-2 <3 RBC/hpf   Bacteria, UA MANY (A) RARE   Urine-Other MUCOUS PRESENT   Pregnancy, urine POC     Status: None   Collection Time: 04/03/15  8:21 PM  Result Value Ref Range   Preg Test, Ur NEGATIVE NEGATIVE  Wet prep, genital     Status: Abnormal   Collection Time: 04/03/15  8:40 PM  Result Value Ref Range   Yeast Wet Prep HPF POC NONE SEEN NONE SEEN   Trich, Wet Prep NONE SEEN NONE SEEN   Clue Cells Wet Prep HPF POC FEW (A) NONE SEEN   WBC, Wet Prep HPF POC FEW (A) NONE SEEN  CBC     Status: Abnormal   Collection Time: 04/03/15  8:50 PM  Result Value Ref Range   WBC 9.0 4.0 - 10.5 K/uL   RBC 3.78 (L) 3.87 - 5.11 MIL/uL   Hemoglobin 12.7 12.0 - 15.0 g/dL   HCT 40.937.6 81.136.0 - 91.446.0 %   MCV 99.5 78.0 - 100.0 fL   MCH 33.6 26.0 - 34.0 pg   MCHC 33.8 30.0 - 36.0 g/dL   RDW 78.212.5 95.611.5 - 21.315.5 %   Platelets 316 150 - 400 K/uL    MAU Course  Procedures  MDM Patient has had toradol pain is slightly better 250mg  Rocephin IM given   Assessment and Plan   1. Cervicitis   2. RLQ abdominal pain    DC home Comfort measures reviewed  RX: doxycyclin 100mg  BID x 14 days  Return to MAU as needed FU with OB as planned  Follow-up Information    Follow up with  [facility].   Why:  If symptoms worsen   Contact information:   [address] [id number]         Tawnya CrookHogan, Lacreshia Bondarenko Donovan 04/03/2015, 8:40 PM

## 2015-04-03 NOTE — MAU Note (Signed)
Pt states pain in her right lower abd x 2 days, states pain is worsening since last pm. Denies nausea, vomiting, diarrhea, or fever. Denies dysuria.

## 2015-04-04 LAB — HIV ANTIBODY (ROUTINE TESTING W REFLEX): HIV SCREEN 4TH GENERATION: NONREACTIVE

## 2015-04-04 LAB — RPR: RPR: NONREACTIVE

## 2015-04-05 LAB — URINE CULTURE

## 2015-04-05 LAB — GC/CHLAMYDIA PROBE AMP (~~LOC~~) NOT AT ARMC
Chlamydia: NEGATIVE
Neisseria Gonorrhea: NEGATIVE

## 2015-12-06 ENCOUNTER — Inpatient Hospital Stay (HOSPITAL_COMMUNITY): Payer: Medicaid Other

## 2015-12-06 ENCOUNTER — Encounter (HOSPITAL_COMMUNITY): Payer: Self-pay | Admitting: *Deleted

## 2015-12-06 ENCOUNTER — Inpatient Hospital Stay (HOSPITAL_COMMUNITY)
Admission: AD | Admit: 2015-12-06 | Discharge: 2015-12-06 | Disposition: A | Discharge status: Home / Self Care | Payer: Medicaid Other | Source: Ambulatory Visit | Attending: Obstetrics & Gynecology | Admitting: Obstetrics & Gynecology

## 2015-12-06 DIAGNOSIS — N949 Unspecified condition associated with female genital organs and menstrual cycle: Secondary | ICD-10-CM | POA: Diagnosis not present

## 2015-12-06 DIAGNOSIS — O9989 Other specified diseases and conditions complicating pregnancy, childbirth and the puerperium: Secondary | ICD-10-CM

## 2015-12-06 DIAGNOSIS — O26892 Other specified pregnancy related conditions, second trimester: Secondary | ICD-10-CM | POA: Diagnosis not present

## 2015-12-06 DIAGNOSIS — R102 Pelvic and perineal pain: Secondary | ICD-10-CM | POA: Insufficient documentation

## 2015-12-06 DIAGNOSIS — F17210 Nicotine dependence, cigarettes, uncomplicated: Secondary | ICD-10-CM | POA: Insufficient documentation

## 2015-12-06 DIAGNOSIS — O99332 Smoking (tobacco) complicating pregnancy, second trimester: Secondary | ICD-10-CM | POA: Insufficient documentation

## 2015-12-06 DIAGNOSIS — R319 Hematuria, unspecified: Secondary | ICD-10-CM | POA: Diagnosis not present

## 2015-12-06 DIAGNOSIS — Z3A20 20 weeks gestation of pregnancy: Secondary | ICD-10-CM | POA: Diagnosis present

## 2015-12-06 DIAGNOSIS — O26899 Other specified pregnancy related conditions, unspecified trimester: Secondary | ICD-10-CM

## 2015-12-06 DIAGNOSIS — M549 Dorsalgia, unspecified: Secondary | ICD-10-CM

## 2015-12-06 LAB — URINALYSIS, ROUTINE W REFLEX MICROSCOPIC
BILIRUBIN URINE: NEGATIVE
GLUCOSE, UA: NEGATIVE mg/dL
KETONES UR: NEGATIVE mg/dL
LEUKOCYTES UA: NEGATIVE
NITRITE: NEGATIVE
PH: 6 (ref 5.0–8.0)
PROTEIN: NEGATIVE mg/dL
Specific Gravity, Urine: 1.02 (ref 1.005–1.030)

## 2015-12-06 LAB — URINE MICROSCOPIC-ADD ON
Bacteria, UA: NONE SEEN
WBC UA: NONE SEEN WBC/hpf (ref 0–5)

## 2015-12-06 NOTE — MAU Provider Note (Signed)
MAU HISTORY AND PHYSICAL  Chief Complaint:  Abdominal Pain   Destiny Perry is a 35 y.o.  [id number] with IUP at 6740w6d presenting for Abdominal Pain . Patient states she has been having  Braxton hicks occasional contractions, none vaginal bleeding, intact membranes, with decreased  fetal movement.  States the pain is primarily on the right lower side and comes and goes.  States baby moves less than usual.  Pain is sharp, intermittent.    RN Note: Patient has been having lower abdominal tightening and right sided pain on and off for past week. Also states baby has not been moving as much for past few days. Denies LOF or vaginal bleeding History reviewed. No pertinent past medical history.  Past Surgical History  Procedure Laterality Date  . Leep    . Leap procedure    . Wisdom tooth extraction      History reviewed. No pertinent family history.  Social History  Substance Use Topics  . Smoking status: Current Every Day Smoker -- 0.50 packs/day    Types: Cigarettes  . Smokeless tobacco: None  . Alcohol Use: No    Allergies  Allergen Reactions  . Bee Venom Anaphylaxis  . Metronidazole Hives    Prescriptions prior to admission  Medication Sig Dispense Refill Last Dose  . albuterol (PROVENTIL HFA;VENTOLIN HFA) 108 (90 BASE) MCG/ACT inhaler Inhale 2 puffs into the lungs every 6 (six) hours as needed for wheezing or shortness of breath.   Past Week at Unknown time  . EPINEPHrine (EPIPEN) 0.3 mg/0.3 mL DEVI Inject 0.3 mLs (0.3 mg total) into the muscle once. 1 Device 0 emergency    Review of Systems - Negative except for what is mentioned in HPI.  Physical Exam  Blood pressure 99/58, pulse 88, temperature 97.7 F (36.5 C), temperature source Oral, resp. rate 16, height 5\' 3"  (1.6 m), weight 57.244 kg (126 lb 3.2 oz), last menstrual period 07/12/2015, SpO2 99 %. GENERAL: Well-developed, well-nourished female in no acute distress.  LUNGS: Clear to auscultation bilaterally.   HEART: Regular rate and rhythm. ABDOMEN: Soft,  nondistended, gravid. Moderate RLQ pain, no rebound or guarding. Marked CVA tenderness on R.  EXTREMITIES: Nontender, no edema, 2+ distal pulses. FHT:  Doppler 150 Contractions: not on monitor  Labs: Results for orders placed or performed during the hospital encounter of 12/06/15 (from the past 24 hour(s))  Urinalysis, Routine w reflex microscopic ([facility])   Collection Time: 12/06/15  9:19 AM  Result Value Ref Range   Color, Urine YELLOW YELLOW   APPearance CLEAR CLEAR   Specific Gravity, Urine 1.020 1.005 - 1.030   pH 6.0 5.0 - 8.0   Glucose, UA NEGATIVE NEGATIVE mg/dL   Hgb urine dipstick LARGE (A) NEGATIVE   Bilirubin Urine NEGATIVE NEGATIVE   Ketones, ur NEGATIVE NEGATIVE mg/dL   Protein, ur NEGATIVE NEGATIVE mg/dL   Nitrite NEGATIVE NEGATIVE   Leukocytes, UA NEGATIVE NEGATIVE  Urine microscopic-add on   Collection Time: 12/06/15  9:19 AM  Result Value Ref Range   Squamous Epithelial / LPF 0-5 (A) NONE SEEN   WBC, UA NONE SEEN 0 - 5 WBC/hpf   RBC / HPF 0-5 0 - 5 RBC/hpf   Bacteria, UA NONE SEEN NONE SEEN    Imaging Studies:  [place] Renal  12/06/2015  CLINICAL DATA:  Hematuria.  Second trimester gestation EXAM: RENAL / URINARY TRACT ULTRASOUND COMPLETE COMPARISON:  CT abdomen and pelvis February 14, 2014 FINDINGS: Right Kidney: Length: 10.8 cm. Echogenicity  and renal cortical thickness are within normal limits. No mass or perinephric fluid. Visualized. There is mild prominence of the renal collecting system without focal area of obstruction. No intrarenal calculi or ureterectasis seen. Left Kidney: Length: 11.1 cm. Echogenicity and renal cortical thickness are within normal limits. No mass or perinephric fluid. Visualized. There is mild prominence of the renal collecting system without focal area of obstruction. No intrarenal calculi or ureterectasis seen. Bladder: Appears normal for degree of bladder distention. Flow from each  distal ureter is seen in the bladder. IMPRESSION: Borderline fullness of each renal collecting system may well be due to the second trimester gestation. No ureteral dilatation seen. No calculi seen. Flow from each ureter is seen in the bladder. Study otherwise within normal limits. Electronically Signed   By: Bretta BangWilliam  Woodruff III M.D.   On: 12/06/2015 13:07   Assessment: Destiny Perry is  35 y.o. [id number] at 4786w6d presents with Abdominal Pain .  Plan: RLQ abdominal pain: UA clear except for large blood. Marked CVA tenderness, moderate RLQ pain, no rebound or guarding. Afebrile, no N/V/D.  -UA findings and lack of fever suggest infection less likely.  -large blood on UA suggests possible kidney stone, will perform renal US.  Call from Wynelle BourgeoisMarie Carely Nappier, CNM who reported Renal Ultrasound is negative.  Order given to discharge patient with dx of Round Ligament Pain. I went in and explained U/S findings and what to expect with round ligament pain.  She is upset that she is unable to see the practice who delivered her previous pregnancy, because they no longer take her insurance.  She plans care in the hospital clinic, she will call them on Monday.     Loni MuseKate Timberlake 7/7/201711:08 AM The patient was seen and examined by me also Agree with note FHR reassuring  Cervical exams as listed in note Ready for discharge Will have her followup in clinic as scheduled Aviva SignsMarie L Princess Karnes, CNM

## 2015-12-06 NOTE — Discharge Instructions (Signed)
Second Trimester of Pregnancy The second trimester is from week 13 through week 28, month 4 through 6. This is often the time in pregnancy that you feel your best. Often times, morning sickness has lessened or quit. You may have more energy, and you may get hungry more often. Your unborn baby (fetus) is growing rapidly. At the end of the sixth month, he or she is about 9 inches long and weighs about 1 pounds. You will likely feel the baby move (quickening) between 18 and 20 weeks of pregnancy. HOME CARE   Avoid all smoking, herbs, and alcohol. Avoid drugs not approved by your doctor.  Do not use any tobacco products, including cigarettes, chewing tobacco, and electronic cigarettes. If you need help quitting, ask your doctor. You may get counseling or other support to help you quit.  Only take medicine as told by your doctor. Some medicines are safe and some are not during pregnancy.  Exercise only as told by your doctor. Stop exercising if you start having cramps.  Eat regular, healthy meals.  Wear a good support bra if your breasts are tender.  Do not use hot tubs, steam rooms, or saunas.  Wear your seat belt when driving.  Avoid raw meat, uncooked cheese, and liter boxes and soil used by cats.  Take your prenatal vitamins.  Take 1500-2000 milligrams of calcium daily starting at the 20th week of pregnancy until you deliver your baby.  Try taking medicine that helps you poop (stool softener) as needed, and if your doctor approves. Eat more fiber by eating fresh fruit, vegetables, and whole grains. Drink enough fluids to keep your pee (urine) clear or pale yellow.  Take warm water baths (sitz baths) to soothe pain or discomfort caused by hemorrhoids. Use hemorrhoid cream if your doctor approves.  If you have puffy, bulging veins (varicose veins), wear support hose. Raise (elevate) your feet for 15 minutes, 3-4 times a day. Limit salt in your diet.  Avoid heavy lifting, wear low heals,  and sit up straight.  Rest with your legs raised if you have leg cramps or low back pain.  Visit your dentist if you have not gone during your pregnancy. Use a soft toothbrush to brush your teeth. Be gentle when you floss.  You can have sex (intercourse) unless your doctor tells you not to.  Go to your doctor visits. GET HELP IF:   You feel dizzy.  You have mild cramps or pressure in your lower belly (abdomen).  You have a nagging pain in your belly area.  You continue to feel sick to your stomach (nauseous), throw up (vomit), or have watery poop (diarrhea).  You have bad smelling fluid coming from your vagina.  You have pain with peeing (urination). GET HELP RIGHT AWAY IF:   You have a fever.  You are leaking fluid from your vagina.  You have spotting or bleeding from your vagina.  You have severe belly cramping or pain.  You lose or gain weight rapidly.  You have trouble catching your breath and have chest pain.  You notice sudden or extreme puffiness (swelling) of your face, hands, ankles, feet, or legs.  You have not felt the baby move in over an hour.  You have severe headaches that do not go away with medicine.  You have vision changes.   This information is not intended to replace advice given to you by your health care provider. Make sure you discuss any questions you have with your  health care provider.   Document Released: 08/12/2009 Document Revised: 06/08/2014 Document Reviewed: 07/19/2012 Elsevier Interactive Patient Education 2016 Elsevier Inc. Round Ligament Pain The round ligament is a cord of muscle and tissue that helps to support the uterus. It can become a source of pain during pregnancy if it becomes stretched or twisted as the baby grows. The pain usually begins in the second trimester of pregnancy, and it can come and go until the baby is delivered. It is not a serious problem, and it does not cause harm to the baby. Round ligament pain is  usually a short, sharp, and pinching pain, but it can also be a dull, lingering, and aching pain. The pain is felt in the lower side of the abdomen or in the groin. It usually starts deep in the groin and moves up to the outside of the hip area. Pain can occur with:  A sudden change in position.  Rolling over in bed.  Coughing or sneezing.  Physical activity. HOME CARE INSTRUCTIONS Watch your condition for any changes. Take these steps to help with your pain:  When the pain starts, relax. Then try:  Sitting down.  Flexing your knees up to your abdomen.  Lying on your side with one pillow under your abdomen and another pillow between your legs.  Sitting in a warm bath for 15-20 minutes or until the pain goes away.  Take over-the-counter and prescription medicines only as told by your health care provider.  Move slowly when you sit and stand.  Avoid long walks if they cause pain.  Stop or lessen your physical activities if they cause pain. SEEK MEDICAL CARE IF:  Your pain does not go away with treatment.  You feel pain in your back that you did not have before.  Your medicine is not helping. SEEK IMMEDIATE MEDICAL CARE IF:  You develop a fever or chills.  You develop uterine contractions.  You develop vaginal bleeding.  You develop nausea or vomiting.  You develop diarrhea.  You have pain when you urinate.   This information is not intended to replace advice given to you by your health care provider. Make sure you discuss any questions you have with your health care provider.   Document Released: 02/25/2008 Document Revised: 08/10/2011 Document Reviewed: 07/25/2014 Elsevier Interactive Patient Education [employer]2016 Elsevier Inc.

## 2015-12-06 NOTE — MAU Note (Signed)
Patient has been having lower abdominal tightening and right sided pain on and off for past week.  Also states baby has not been moving as much for past few days.  Denies LOF or vaginal bleeding.

## 2015-12-19 ENCOUNTER — Other Ambulatory Visit (HOSPITAL_COMMUNITY)
Admission: RE | Admit: 2015-12-19 | Discharge: 2015-12-19 | Disposition: A | Discharge status: Home / Self Care | Payer: Medicaid Other | Source: Ambulatory Visit | Attending: Obstetrics & Gynecology | Admitting: Obstetrics & Gynecology

## 2015-12-19 ENCOUNTER — Ambulatory Visit (INDEPENDENT_AMBULATORY_CARE_PROVIDER_SITE_OTHER): Payer: Medicaid Other | Admitting: Obstetrics & Gynecology

## 2015-12-19 ENCOUNTER — Encounter: Payer: Self-pay | Admitting: Obstetrics & Gynecology

## 2015-12-19 VITALS — BP 99/57 | HR 64 | Wt 127.9 lb

## 2015-12-19 DIAGNOSIS — Z1151 Encounter for screening for human papillomavirus (HPV): Secondary | ICD-10-CM | POA: Insufficient documentation

## 2015-12-19 DIAGNOSIS — O09529 Supervision of elderly multigravida, unspecified trimester: Secondary | ICD-10-CM | POA: Insufficient documentation

## 2015-12-19 DIAGNOSIS — O0930 Supervision of pregnancy with insufficient antenatal care, unspecified trimester: Secondary | ICD-10-CM | POA: Insufficient documentation

## 2015-12-19 DIAGNOSIS — O09522 Supervision of elderly multigravida, second trimester: Secondary | ICD-10-CM | POA: Diagnosis not present

## 2015-12-19 DIAGNOSIS — O0992 Supervision of high risk pregnancy, unspecified, second trimester: Secondary | ICD-10-CM

## 2015-12-19 DIAGNOSIS — Z3482 Encounter for supervision of other normal pregnancy, second trimester: Secondary | ICD-10-CM | POA: Diagnosis present

## 2015-12-19 DIAGNOSIS — Z01419 Encounter for gynecological examination (general) (routine) without abnormal findings: Secondary | ICD-10-CM | POA: Diagnosis present

## 2015-12-19 DIAGNOSIS — Z3480 Encounter for supervision of other normal pregnancy, unspecified trimester: Secondary | ICD-10-CM | POA: Insufficient documentation

## 2015-12-19 DIAGNOSIS — O0932 Supervision of pregnancy with insufficient antenatal care, second trimester: Secondary | ICD-10-CM

## 2015-12-19 LAB — POCT URINALYSIS DIP (DEVICE)
BILIRUBIN URINE: NEGATIVE
Glucose, UA: NEGATIVE mg/dL
KETONES UR: NEGATIVE mg/dL
Nitrite: NEGATIVE
PH: 6 (ref 5.0–8.0)
Protein, ur: NEGATIVE mg/dL
SPECIFIC GRAVITY, URINE: 1.025 (ref 1.005–1.030)
Urobilinogen, UA: 0.2 mg/dL (ref 0.0–1.0)

## 2015-12-19 NOTE — Progress Notes (Signed)
Urine: moderate hgb, trace wbc Initial prenatal labs Schedule u/s 17-p application?

## 2015-12-19 NOTE — Progress Notes (Signed)
   Subjective: nob    Destiny Perry is a [id number] 2546w5d being seen today for her first obstetrical visit.  Her obstetrical history is significant for advanced maternal age. Patient does intend to breast feed. Pregnancy history fully reviewed.  Patient reports no complaints.  Filed Vitals:   12/19/15 0836  BP: 99/57  Pulse: 64  Weight: 127 lb 14.4 oz (58.015 kg)    HISTORY: OB History  Gravida Para Term Preterm AB SAB TAB Ectopic Multiple Living  3 1 0 1 1 1 0 0 0 1     # Outcome Date GA Lbr Len/2nd Weight Sex Delivery Anes PTL Lv  3 Current           2 Preterm 2006 6667w0d   F Vag-Spont EPI Y Y  1 SAB 2001             Past Medical History  Diagnosis Date  . Bronchitis, chronic (HCC)    Past Surgical History  Procedure Laterality Date  . Leep    . Leap procedure    . Wisdom tooth extraction     Family History  Problem Relation Age of Onset  . Hypertension Mother   . Hyperlipidemia Mother   . Heart disease Maternal Aunt   . Heart disease Maternal Uncle   . Cancer Paternal Aunt     breast cancer  . Heart disease Maternal Grandmother   . Heart disease Maternal Grandfather      Exam    Uterus:     Pelvic Exam:    Perineum: No Hemorrhoids   Vulva: normal   Vagina:  normal mucosa   pH:     Cervix: no lesions   Adnexa: not evaluated   Bony Pelvis: average  System: Breast:  normal appearance, no masses or tenderness   Skin: normal coloration and turgor, no rashes    Neurologic: oriented, normal mood   Extremities: normal strength, tone, and muscle mass   HEENT thyroid without masses   Mouth/Teeth dental hygiene good   Neck supple   Cardiovascular: regular rate and rhythm   Respiratory:  appears well, vitals normal, no respiratory distress, acyanotic, normal RR, neck free of mass or lymphadenopathy, chest clear, no wheezing, crepitations, rhonchi, normal symmetric air entry   Abdomen: soft, non-tender; bowel sounds normal; no masses,  no organomegaly   Urinary: urethral meatus normal      Assessment:    Pregnancy: [id number] Patient Active Problem List   Diagnosis Date Noted  . Supervision of other normal pregnancy, antepartum 12/19/2015  . Antepartum multigravida of advanced maternal age 56/20/2017        Plan:     Initial labs drawn. Prenatal vitamins. Problem list reviewed and updated. Genetic Screening discussed Quad Screen: ordered.  Ultrasound discussed; fetal survey: ordered.  Follow up in 4 weeks. 50% of 30 min visit spent on counseling and coordination of care.  Considering Panorama for screening   ARNOLD,JAMES 12/19/2015

## 2015-12-19 NOTE — Patient Instructions (Signed)

## 2015-12-20 LAB — PRENATAL PROFILE (SOLSTAS)
Antibody Screen: NEGATIVE
BASOS ABS: 0 {cells}/uL (ref 0–200)
Basophils Relative: 0 %
Eosinophils Absolute: 484 cells/uL (ref 15–500)
Eosinophils Relative: 4 %
HCT: 34 % — ABNORMAL LOW (ref 35.0–45.0)
HEP B S AG: NEGATIVE
HIV: NONREACTIVE
Hemoglobin: 11.5 g/dL — ABNORMAL LOW (ref 11.7–15.5)
LYMPHS ABS: 3630 {cells}/uL (ref 850–3900)
Lymphocytes Relative: 30 %
MCH: 33.6 pg — AB (ref 27.0–33.0)
MCHC: 33.8 g/dL (ref 32.0–36.0)
MCV: 99.4 fL (ref 80.0–100.0)
MONO ABS: 726 {cells}/uL (ref 200–950)
MPV: 9.4 fL (ref 7.5–12.5)
Monocytes Relative: 6 %
NEUTROS PCT: 60 %
Neutro Abs: 7260 cells/uL (ref 1500–7800)
Platelets: 339 10*3/uL (ref 140–400)
RBC: 3.42 MIL/uL — ABNORMAL LOW (ref 3.80–5.10)
RDW: 13.5 % (ref 11.0–15.0)
RUBELLA: 1.67 {index} — AB (ref <0.90)
Rh Type: POSITIVE
WBC: 12.1 10*3/uL — ABNORMAL HIGH (ref 3.8–10.8)

## 2015-12-20 LAB — CULTURE, OB URINE: Organism ID, Bacteria: 10000

## 2015-12-20 LAB — GC/CHLAMYDIA PROBE AMP (~~LOC~~) NOT AT ARMC
CHLAMYDIA, DNA PROBE: NEGATIVE
NEISSERIA GONORRHEA: NEGATIVE

## 2015-12-22 LAB — PAIN MGMT, PROFILE 6 CONF W/O MM, U
6 ACETYLMORPHINE: NEGATIVE ng/mL (ref <10)
Alcohol Metabolites: NEGATIVE ng/mL (ref <500)
Amphetamines: NEGATIVE ng/mL (ref <500)
BARBITURATES: NEGATIVE ng/mL (ref <300)
BENZODIAZEPINES: NEGATIVE ng/mL (ref <100)
Benzoylecgonine: >75000 ng/mL — ABNORMAL HIGH (ref <100)
CREATININE: 131.6 mg/dL (ref >=20.0)
Cocaine Metabolite: POSITIVE ng/mL — AB (ref <150)
Codeine: NEGATIVE ng/mL (ref <50)
HYDROMORPHONE: 685 ng/mL — AB (ref <50)
Hydrocodone: 18320 ng/mL — ABNORMAL HIGH (ref <50)
MORPHINE: NEGATIVE ng/mL (ref <50)
Marijuana Metabolite: NEGATIVE ng/mL (ref <20)
Methadone Metabolite: NEGATIVE ng/mL (ref <100)
Norhydrocodone: >10000 ng/mL — ABNORMAL HIGH (ref <50)
OXIDANT: NEGATIVE ug/mL (ref <200)
Opiates: POSITIVE ng/mL — AB (ref <100)
Oxycodone: NEGATIVE ng/mL (ref <100)
Phencyclidine: NEGATIVE ng/mL (ref <25)
Please note:: 0
pH: 7.04 (ref 4.5–9.0)

## 2015-12-23 LAB — CYTOLOGY - PAP

## 2015-12-24 ENCOUNTER — Other Ambulatory Visit: Payer: Self-pay | Admitting: Obstetrics & Gynecology

## 2015-12-24 ENCOUNTER — Encounter (HOSPITAL_COMMUNITY): Payer: Self-pay

## 2015-12-24 ENCOUNTER — Ambulatory Visit (HOSPITAL_COMMUNITY)
Admission: RE | Admit: 2015-12-24 | Discharge: 2015-12-24 | Disposition: A | Discharge status: Home / Self Care | Payer: Medicaid Other | Source: Ambulatory Visit | Attending: Obstetrics & Gynecology | Admitting: Obstetrics & Gynecology

## 2015-12-24 DIAGNOSIS — O280 Abnormal hematological finding on antenatal screening of mother: Secondary | ICD-10-CM

## 2015-12-24 DIAGNOSIS — Z3A23 23 weeks gestation of pregnancy: Secondary | ICD-10-CM

## 2015-12-24 DIAGNOSIS — Z1389 Encounter for screening for other disorder: Secondary | ICD-10-CM

## 2015-12-24 DIAGNOSIS — Z3A24 24 weeks gestation of pregnancy: Secondary | ICD-10-CM

## 2015-12-24 DIAGNOSIS — Z36 Encounter for antenatal screening of mother: Secondary | ICD-10-CM | POA: Insufficient documentation

## 2015-12-24 DIAGNOSIS — O09522 Supervision of elderly multigravida, second trimester: Secondary | ICD-10-CM

## 2015-12-24 DIAGNOSIS — O0992 Supervision of high risk pregnancy, unspecified, second trimester: Secondary | ICD-10-CM

## 2015-12-24 DIAGNOSIS — O99332 Smoking (tobacco) complicating pregnancy, second trimester: Secondary | ICD-10-CM | POA: Diagnosis not present

## 2015-12-24 LAB — AFP, QUAD SCREEN
AFP: 83.5 ng/mL
Age Alone: 1:232 {titer}
Curr Gest Age: 22.7 weeks
Down Syndrome Scr Risk Est: 1:1390 {titer}
HCG TOTAL: 5.59 [IU]/mL
INH: 559.8 pg/mL
Interpretation-AFP: NEGATIVE
MOM FOR AFP: 0.91
MOM FOR INH: 1.84
MoM for hCG: 0.26
OPEN SPINA BIFIDA: NEGATIVE
Osb Risk: 1:18000 {titer}
Tri 18 Scr Risk Est: POSITIVE — AB
Trisomy 18 (Edward) Syndrome Interp.: 1:96 {titer}
uE3 Mom: 0.72
uE3 Value: 1.95 ng/mL

## 2015-12-26 LAB — CYSTIC FIBROSIS DIAGNOSTIC STUDY

## 2015-12-30 ENCOUNTER — Encounter: Payer: Self-pay | Admitting: Obstetrics and Gynecology

## 2015-12-30 DIAGNOSIS — O285 Abnormal chromosomal and genetic finding on antenatal screening of mother: Secondary | ICD-10-CM | POA: Insufficient documentation

## 2015-12-30 DIAGNOSIS — O99320 Drug use complicating pregnancy, unspecified trimester: Secondary | ICD-10-CM | POA: Insufficient documentation

## 2016-01-01 ENCOUNTER — Telehealth (HOSPITAL_COMMUNITY): Payer: Self-pay | Admitting: Genetics

## 2016-01-01 ENCOUNTER — Other Ambulatory Visit (HOSPITAL_COMMUNITY): Payer: Self-pay

## 2016-01-01 ENCOUNTER — Telehealth: Payer: Self-pay | Admitting: *Deleted

## 2016-01-01 NOTE — Telephone Encounter (Signed)
Opened in error

## 2016-01-01 NOTE — Telephone Encounter (Signed)
Called Francesco Runner to discuss her prenatal cell free DNA screening results.  Ms. Destiny Perry had Panorama screening through [facility].  Screening was offered because of AMA and increased risk for Trisomy 18 based on Quad screen.   The patient was identified by name and DOB.  We reviewed that these are within normal limits, showing a less than 1 in 10,000 risk for trisomies 21, 18 and 13, and monosomy X (Turner syndrome).  In addition, the risk for triploidy/vanishing twin and sex chromosome trisomies (47,XXX and 47,XXY) was also low risk.   We reviewed that this testing identifies > 99% of pregnancies with trisomy 68, trisomy 64, sex chromosome trisomies (47,XXX and 47,XXY), and triploidy. The detection rate for trisomy 18 is 96%.  The detection rate for monosomy X is ~92%.  The false positive rate is <0.1% for all conditions. Testing was also consistent with female fetal sex.    She understands that this testing does not identify all genetic conditions.  All questions were answered to her satisfaction, she was encouraged to call with additional questions or concerns.  Mady Gemma, MS Certified Genetic Counselor

## 2016-01-22 ENCOUNTER — Ambulatory Visit (INDEPENDENT_AMBULATORY_CARE_PROVIDER_SITE_OTHER): Payer: Medicaid Other | Admitting: Obstetrics and Gynecology

## 2016-01-22 ENCOUNTER — Ambulatory Visit (INDEPENDENT_AMBULATORY_CARE_PROVIDER_SITE_OTHER): Payer: Medicaid Other | Admitting: Clinical

## 2016-01-22 VITALS — BP 100/72 | HR 100 | Wt 134.0 lb

## 2016-01-22 DIAGNOSIS — Z3482 Encounter for supervision of other normal pregnancy, second trimester: Secondary | ICD-10-CM

## 2016-01-22 DIAGNOSIS — Z23 Encounter for immunization: Secondary | ICD-10-CM | POA: Diagnosis not present

## 2016-01-22 DIAGNOSIS — F4322 Adjustment disorder with anxiety: Secondary | ICD-10-CM

## 2016-01-22 DIAGNOSIS — N898 Other specified noninflammatory disorders of vagina: Secondary | ICD-10-CM

## 2016-01-22 DIAGNOSIS — O285 Abnormal chromosomal and genetic finding on antenatal screening of mother: Secondary | ICD-10-CM

## 2016-01-22 DIAGNOSIS — O09522 Supervision of elderly multigravida, second trimester: Secondary | ICD-10-CM

## 2016-01-22 DIAGNOSIS — O0932 Supervision of pregnancy with insufficient antenatal care, second trimester: Secondary | ICD-10-CM | POA: Diagnosis present

## 2016-01-22 LAB — CBC
HCT: 33.2 % — ABNORMAL LOW (ref 35.0–45.0)
Hemoglobin: 10.9 g/dL — ABNORMAL LOW (ref 11.7–15.5)
MCH: 32.7 pg (ref 27.0–33.0)
MCHC: 32.8 g/dL (ref 32.0–36.0)
MCV: 99.7 fL (ref 80.0–100.0)
MPV: 9.9 fL (ref 7.5–12.5)
PLATELETS: 314 10*3/uL (ref 140–400)
RBC: 3.33 MIL/uL — AB (ref 3.80–5.10)
RDW: 13 % (ref 11.0–15.0)
WBC: 14.2 10*3/uL — AB (ref 3.8–10.8)

## 2016-01-22 MED ORDER — TETANUS-DIPHTH-ACELL PERTUSSIS 5-2.5-18.5 LF-MCG/0.5 IM SUSP
0.5000 mL | Freq: Once | INTRAMUSCULAR | Status: AC
  Administered 2016-01-22: 0.5 mL via INTRAMUSCULAR

## 2016-01-22 NOTE — Progress Notes (Signed)
Patient needs refill on epi-pen

## 2016-01-22 NOTE — Progress Notes (Signed)
  ASSESSMENT: Pt currently experiencing Adjustment disorder with anxious mood. Pt needs to f/u with OB. Pt would benefit from psychoeducation and brief therapeutic intervention regarding symptoms of anxiety. Stage of Change: contemplative  PLAN: 1. F/U with behavioral health clinician in one month, or as needed 2. Psychiatric Medications: none 3. Behavioral recommendations:   -Consider relaxation breathing exercises -Consider worry hour technique, as discussed in office visit today, to prioritize life stressors -Consider trying out different sound apps to help fall back asleep  SUBJECTIVE: Pt. referred by  Nettie ElmMichael Ervin, MD, for symptoms of anxiety  Pt. 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  Duration of problem: Increase in past month Severity: moderate   OBJECTIVE: Orientation & Cognition: Oriented x3. Thought processes normal and appropriate to situation. Mood: appropriate Affect: appropriate Appearance: appropriate Risk of harm to self or others: no known risk of harm to self or others Substance use: none Assessments administered: PHQ9: 5/ GAD7: 14  Diagnosis: Adjustment disorder with anxious mood CPT Code: F43.22  -------------------------------------------- Other(s) present in the room:  Father of baby  Time spent with patient in exam room: 30 minutes, 4:15-4:45pm  [facility] 01/22/2016  Decreased Interest 0  Down, Depressed, Hopeless 0  PHQ - 2 Score 0  Altered sleeping 1  Tired, decreased energy 2  Change in appetite 2  Feeling bad or failure about yourself  0  Trouble concentrating 0  Moving slowly or fidgety/restless 0  Suicidal thoughts 0  PHQ-9 Score 5   GAD 7 : Generalized Anxiety Score 01/22/2016  Nervous, Anxious, on Edge 1   Control/stop worrying 3  Worry too much - different things 3  Trouble relaxing 2  Restless 2  Easily annoyed or irritable 2  Afraid - awful might happen 1  Total GAD 7 Score 14

## 2016-01-22 NOTE — Patient Instructions (Signed)

## 2016-01-22 NOTE — Progress Notes (Signed)
Subjective:  Destiny Perry is a 35 y.o. [id number] at 6153w4d being seen today for ongoing prenatal care.  She is currently monitored for the following issues for this low-risk pregnancy and has Supervision of other normal pregnancy, antepartum; Antepartum multigravida of advanced maternal age; Late prenatal care affecting pregnancy, antepartum; Substance abuse affecting pregnancy, antepartum; and Abnormal chromosomal and genetic finding on antenatal screening mother on her problem list.  Patient reports Vaginal cyst.  Contractions: Irritability. Vag. Bleeding: None.  Movement: Present. Denies leaking of fluid.   The following portions of the patient's history were reviewed and updated as appropriate: allergies, current medications, past family history, past medical history, past social history, past surgical history and problem list. Problem list updated.  Objective:   Vitals:   01/22/16 1552  BP: 100/72  Pulse: 100  Weight: 134 lb (60.8 kg)    Fetal Status: Fetal Heart Rate (bpm): 145   Movement: Present     General:  Alert, oriented and cooperative. Patient is in no acute distress.  Skin: Skin is warm and dry. No rash noted.   Cardiovascular: Normal heart rate noted  Respiratory: Normal respiratory effort, no problems with respiration noted  Abdomen: Soft, gravid, appropriate for gestational age. Pain/Pressure: Absent     Pelvic:  Cervical exam deferred    Small 0.5 cm mucous cyst at vaginal intorious,   area was sprayed with Hurricane spray and stab incision with 18 gauge needle was made, 2-3 cc of yellowish d/c expressed. Pt tolerated well.  Extremities: Normal range of motion.  Edema: None  Mental Status: Normal mood and affect. Normal behavior. Normal judgment and thought content.   Urinalysis:      Assessment and Plan:  Pregnancy: [id number] at 5153w4d  1. Late prenatal care affecting pregnancy in second trimester - CBC - RPR - HIV antibody (with reflex) - Glucose Tolerance, 1 HR  (50g) w/o Fasting - Tdap (BOOSTRIX) injection 0.5 mL; Inject 0.5 mLs into the muscle once.  2. Supervision of other normal pregnancy, antepartum, second trimester  3. Antepartum multigravida of advanced maternal age, second trimester  4. Abnormal chromosomal and genetic finding on antenatal screening mother  Cindy Hazyanorama was negative. Has f/u U/S in September.  5. Cyst of vagina See above  Preterm labor symptoms and general obstetric precautions including but not limited to vaginal bleeding, contractions, leaking of fluid and fetal movement were reviewed in detail with the patient. Please refer to After Visit Summary for other counseling recommendations.  Return in about 2 weeks (around 02/05/2016) for OB visit.   Hermina StaggersMichael L Ladon Heney, MD

## 2016-01-23 LAB — GLUCOSE TOLERANCE, 1 HOUR (50G) W/O FASTING: GLUCOSE, 1 HR, GESTATIONAL: 100 mg/dL (ref <140)

## 2016-01-23 LAB — RPR

## 2016-01-23 LAB — HIV ANTIBODY (ROUTINE TESTING W REFLEX): HIV: NONREACTIVE

## 2016-01-29 ENCOUNTER — Telehealth: Payer: Self-pay | Admitting: *Deleted

## 2016-01-29 NOTE — Telephone Encounter (Addendum)
Pt left message stating that she has questions about getting a referral to pain management center.   8/31  I called pt and discussed her request.  She stated that she has been struggling with opioid addiction for several years. She knows friends who have taken subutex or suboxan during their pregnancy and she has been doing so as well. She does not want the baby to go through withdrawal after birth and wants her condition to be managed and supervised properly. Because of her social situation, she has moved in temporarily with her mother who lives in the [place] area. She intends [place] continue [place] get [place] prenatal [place] in [place] but requests referral to pain management center locally. She provided the name of one resource - [facility] [address] [place].  She also has information of another local center and will call back with that information. I explained to pt that this is a complex situation and since we are only open for 4 hrs tomorrow and closed on Monday, it will likely be next week before I can call back with any information on her request. Pt voiced understanding and expressed appreciation for any assistance she can receive towards her request.

## 2016-02-04 ENCOUNTER — Encounter (HOSPITAL_COMMUNITY): Payer: Self-pay

## 2016-02-04 ENCOUNTER — Other Ambulatory Visit (HOSPITAL_COMMUNITY): Payer: Self-pay | Admitting: Maternal and Fetal Medicine

## 2016-02-04 ENCOUNTER — Ambulatory Visit (HOSPITAL_COMMUNITY)
Admission: RE | Admit: 2016-02-04 | Discharge: 2016-02-04 | Disposition: A | Discharge status: Home / Self Care | Payer: Medicaid Other | Source: Ambulatory Visit | Attending: Obstetrics & Gynecology | Admitting: Obstetrics & Gynecology

## 2016-02-04 DIAGNOSIS — O280 Abnormal hematological finding on antenatal screening of mother: Secondary | ICD-10-CM

## 2016-02-04 DIAGNOSIS — Z3A29 29 weeks gestation of pregnancy: Secondary | ICD-10-CM

## 2016-02-04 DIAGNOSIS — O09523 Supervision of elderly multigravida, third trimester: Secondary | ICD-10-CM

## 2016-02-04 DIAGNOSIS — O283 Abnormal ultrasonic finding on antenatal screening of mother: Secondary | ICD-10-CM | POA: Diagnosis present

## 2016-02-04 DIAGNOSIS — Z3A33 33 weeks gestation of pregnancy: Secondary | ICD-10-CM

## 2016-02-04 DIAGNOSIS — O285 Abnormal chromosomal and genetic finding on antenatal screening of mother: Secondary | ICD-10-CM

## 2016-02-04 DIAGNOSIS — Z1389 Encounter for screening for other disorder: Secondary | ICD-10-CM

## 2016-02-04 DIAGNOSIS — O99333 Smoking (tobacco) complicating pregnancy, third trimester: Secondary | ICD-10-CM

## 2016-02-04 DIAGNOSIS — O99320 Drug use complicating pregnancy, unspecified trimester: Secondary | ICD-10-CM

## 2016-02-07 ENCOUNTER — Encounter: Payer: Self-pay | Admitting: Obstetrics and Gynecology

## 2016-02-11 NOTE — Telephone Encounter (Signed)
Patient called again and is waiting on Diane to returned her call regarding her request.

## 2016-02-13 ENCOUNTER — Ambulatory Visit (INDEPENDENT_AMBULATORY_CARE_PROVIDER_SITE_OTHER): Payer: Medicaid Other | Admitting: Obstetrics and Gynecology

## 2016-02-13 VITALS — BP 98/68 | HR 95 | Wt 133.0 lb

## 2016-02-13 DIAGNOSIS — F1910 Other psychoactive substance abuse, uncomplicated: Secondary | ICD-10-CM

## 2016-02-13 DIAGNOSIS — O09523 Supervision of elderly multigravida, third trimester: Secondary | ICD-10-CM

## 2016-02-13 DIAGNOSIS — O99320 Drug use complicating pregnancy, unspecified trimester: Secondary | ICD-10-CM

## 2016-02-13 DIAGNOSIS — Z23 Encounter for immunization: Secondary | ICD-10-CM

## 2016-02-13 DIAGNOSIS — Z3483 Encounter for supervision of other normal pregnancy, third trimester: Secondary | ICD-10-CM

## 2016-02-13 MED ORDER — EPINEPHRINE 0.3 MG/0.3ML IJ SOAJ: 0.3000 mg | Freq: Once | INTRAMUSCULAR | Status: DC

## 2016-02-13 MED ORDER — PRENATAL VITAMINS 0.8 MG PO TABS: 1.0000 | ORAL_TABLET | Freq: Every day | ORAL | 12 refills | Status: DC

## 2016-02-13 NOTE — Telephone Encounter (Signed)
Patient is here in the office to be seen today will address her concerns today.

## 2016-02-13 NOTE — Progress Notes (Signed)
Patient reports 2-5 contractions per day  Patient wants referral to Pain Center to get started on suboxone

## 2016-02-13 NOTE — Progress Notes (Signed)
Subjective:  Destiny Perry is a 35 y.o. [id number] at [redacted]w[redacted]d being seen today for ongoing prenatal care.  She is currently monitored for the following issues for this high-risk pregnancy and has Supervision of other normal pregnancy, antepartum; Antepartum multigravida of advanced maternal age; Late prenatal care affecting pregnancy, antepartum; Substance abuse affecting pregnancy, antepartum; and Abnormal chromosomal and genetic finding on antenatal screening mother on her problem list.  Patient reports no complaints.  Contractions: Irritability. Vag. Bleeding: None.  Movement: Present. Denies leaking of fluid.   The following portions of the patient's history were reviewed and updated as appropriate: allergies, current medications, past family history, past medical history, past social history, past surgical history and problem list. Problem list updated.  Objective:   Vitals:   02/13/16 1306  BP: 98/68  Pulse: 95  Weight: 133 lb (60.3 kg)    Fetal Status: Fetal Heart Rate (bpm): 135   Movement: Present     General:  Alert, oriented and cooperative. Patient is in no acute distress.  Skin: Skin is warm and dry. No rash noted.   Cardiovascular: Normal heart rate noted  Respiratory: Normal respiratory effort, no problems with respiration noted  Abdomen: Soft, gravid, appropriate for gestational age. Pain/Pressure: Present     Pelvic:  Cervical exam deferred        Extremities: Normal range of motion.  Edema: None  Mental Status: Normal mood and affect. Normal behavior. Normal judgment and thought content.   Urinalysis:      Assessment and Plan:  Pregnancy: [id number] at 711w5d  1. Supervision of other normal pregnancy, antepartum, third trimester  - Flu Vaccine QUAD 36+ mos IM (Fluarix, Quad PF)  2. Substance abuse affecting pregnancy, antepartum [facility]. Will assist the pt in this today  3. Antepartum multigravida of advanced  maternal age, third trimester   Preterm labor symptoms and general obstetric precautions including but not limited to vaginal bleeding, contractions, leaking of fluid and fetal movement were reviewed in detail with the patient. Please refer to After Visit Summary for other counseling recommendations.  Return in about 2 weeks (around 02/27/2016).   Hermina StaggersMichael L Josanna Hefel, MD

## 2016-02-13 NOTE — Progress Notes (Signed)
No urine provided today during ob visit. [facility] per patient request and order Dr. Alysia PennaErvin. Referral faxed, they will call patient with appointment within the week.

## 2016-03-03 ENCOUNTER — Ambulatory Visit (HOSPITAL_COMMUNITY)
Admission: RE | Admit: 2016-03-03 | Discharge: 2016-03-03 | Disposition: A | Discharge status: Home / Self Care | Payer: Medicaid Other | Source: Ambulatory Visit | Attending: Obstetrics & Gynecology | Admitting: Obstetrics & Gynecology

## 2016-03-03 ENCOUNTER — Ambulatory Visit (INDEPENDENT_AMBULATORY_CARE_PROVIDER_SITE_OTHER): Payer: Medicaid Other | Admitting: Advanced Practice Midwife

## 2016-03-03 ENCOUNTER — Encounter (HOSPITAL_COMMUNITY): Payer: Self-pay

## 2016-03-03 ENCOUNTER — Other Ambulatory Visit (HOSPITAL_COMMUNITY): Payer: Self-pay | Admitting: Maternal and Fetal Medicine

## 2016-03-03 VITALS — BP 107/60 | HR 77 | Temp 97.4°F

## 2016-03-03 DIAGNOSIS — O99323 Drug use complicating pregnancy, third trimester: Secondary | ICD-10-CM

## 2016-03-03 DIAGNOSIS — Z91038 Other insect allergy status: Secondary | ICD-10-CM

## 2016-03-03 DIAGNOSIS — O285 Abnormal chromosomal and genetic finding on antenatal screening of mother: Secondary | ICD-10-CM

## 2016-03-03 DIAGNOSIS — Z3A33 33 weeks gestation of pregnancy: Secondary | ICD-10-CM

## 2016-03-03 DIAGNOSIS — O0992 Supervision of high risk pregnancy, unspecified, second trimester: Secondary | ICD-10-CM

## 2016-03-03 DIAGNOSIS — O289 Unspecified abnormal findings on antenatal screening of mother: Secondary | ICD-10-CM

## 2016-03-03 DIAGNOSIS — O99333 Smoking (tobacco) complicating pregnancy, third trimester: Secondary | ICD-10-CM

## 2016-03-03 DIAGNOSIS — F1910 Other psychoactive substance abuse, uncomplicated: Secondary | ICD-10-CM

## 2016-03-03 DIAGNOSIS — O09523 Supervision of elderly multigravida, third trimester: Secondary | ICD-10-CM

## 2016-03-03 DIAGNOSIS — O283 Abnormal ultrasonic finding on antenatal screening of mother: Secondary | ICD-10-CM | POA: Diagnosis present

## 2016-03-03 DIAGNOSIS — J45909 Unspecified asthma, uncomplicated: Secondary | ICD-10-CM | POA: Insufficient documentation

## 2016-03-03 DIAGNOSIS — O99332 Smoking (tobacco) complicating pregnancy, second trimester: Secondary | ICD-10-CM | POA: Insufficient documentation

## 2016-03-03 DIAGNOSIS — Z91030 Bee allergy status: Secondary | ICD-10-CM | POA: Insufficient documentation

## 2016-03-03 DIAGNOSIS — O99320 Drug use complicating pregnancy, unspecified trimester: Secondary | ICD-10-CM

## 2016-03-03 DIAGNOSIS — O4693 Antepartum hemorrhage, unspecified, third trimester: Secondary | ICD-10-CM

## 2016-03-03 DIAGNOSIS — O99519 Diseases of the respiratory system complicating pregnancy, unspecified trimester: Principal | ICD-10-CM

## 2016-03-03 DIAGNOSIS — O99513 Diseases of the respiratory system complicating pregnancy, third trimester: Secondary | ICD-10-CM

## 2016-03-03 MED ORDER — PRENATAL VITAMINS 0.8 MG PO TABS: 1.0000 | ORAL_TABLET | Freq: Every day | ORAL | 12 refills | Status: AC

## 2016-03-03 MED ORDER — ALBUTEROL SULFATE HFA 108 (90 BASE) MCG/ACT IN AERS: 2.0000 | INHALATION_SPRAY | Freq: Four times a day (QID) | RESPIRATORY_TRACT | 5 refills | Status: AC | PRN

## 2016-03-03 MED ORDER — EPINEPHRINE 0.3 MG/0.3ML IJ SOAJ: 0.3000 mg | Freq: Once | INTRAMUSCULAR | 2 refills | Status: AC

## 2016-03-03 NOTE — Patient Instructions (Signed)
Third Trimester of Pregnancy The third trimester is from week 29 through week 42, months 7 through 9. The third trimester is a time when the fetus is growing rapidly. At the end of the ninth month, the fetus is about 20 inches in length and weighs 6-10 pounds.  BODY CHANGES Your body goes through many changes during pregnancy. The changes vary from woman to woman.   Your weight will continue to increase. You can expect to gain 25-35 pounds (11-16 kg) by the end of the pregnancy.  You may begin to get stretch marks on your hips, abdomen, and breasts.  You may urinate more often because the fetus is moving lower into your pelvis and pressing on your bladder.  You may develop or continue to have heartburn as a result of your pregnancy.  You may develop constipation because certain hormones are causing the muscles that push waste through your intestines to slow down.  You may develop hemorrhoids or swollen, bulging veins (varicose veins).  You may have pelvic pain because of the weight gain and pregnancy hormones relaxing your joints between the bones in your pelvis. Backaches may result from overexertion of the muscles supporting your posture.  You may have changes in your hair. These can include thickening of your hair, rapid growth, and changes in texture. Some women also have hair loss during or after pregnancy, or hair that feels dry or thin. Your hair will most likely return to normal after your baby is born.  Your breasts will continue to grow and be tender. A yellow discharge may leak from your breasts called colostrum.  Your belly button may stick out.  You may feel short of breath because of your expanding uterus.  You may notice the fetus "dropping," or moving lower in your abdomen.  You may have a bloody mucus discharge. This usually occurs a few days to a week before labor begins.  Your cervix becomes thin and soft (effaced) near your due date. WHAT TO EXPECT AT YOUR PRENATAL  EXAMS  You will have prenatal exams every 2 weeks until week 36. Then, you will have weekly prenatal exams. During a routine prenatal visit:  You will be weighed to make sure you and the fetus are growing normally.  Your blood pressure is taken.  Your abdomen will be measured to track your baby's growth.  The fetal heartbeat will be listened to.  Any test results from the previous visit will be discussed.  You may have a cervical check near your due date to see if you have effaced. At around 36 weeks, your caregiver will check your cervix. At the same time, your caregiver will also perform a test on the secretions of the vaginal tissue. This test is to determine if a type of bacteria, Group B streptococcus, is present. Your caregiver will explain this further. Your caregiver may ask you:  What your birth plan is.  How you are feeling.  If you are feeling the baby move.  If you have had any abnormal symptoms, such as leaking fluid, bleeding, severe headaches, or abdominal cramping.  If you are using any tobacco products, including cigarettes, chewing tobacco, and electronic cigarettes.  If you have any questions. Other tests or screenings that may be performed during your third trimester include:  Blood tests that check for low iron levels (anemia).  Fetal testing to check the health, activity level, and growth of the fetus. Testing is done if you have certain medical conditions or if   there are problems during the pregnancy.  HIV (human immunodeficiency virus) testing. If you are at high risk, you may be screened for HIV during your third trimester of pregnancy. FALSE LABOR You may feel small, irregular contractions that eventually go away. These are called Braxton Hicks contractions, or false labor. Contractions may last for hours, days, or even weeks before true labor sets in. If contractions come at regular intervals, intensify, or become painful, it is best to be seen by your  caregiver.  SIGNS OF LABOR   Menstrual-like cramps.  Contractions that are 5 minutes apart or less.  Contractions that start on the top of the uterus and spread down to the lower abdomen and back.  A sense of increased pelvic pressure or back pain.  A watery or bloody mucus discharge that comes from the vagina. If you have any of these signs before the 37th week of pregnancy, call your caregiver right away. You need to go to the hospital to get checked immediately. HOME CARE INSTRUCTIONS   Avoid all smoking, herbs, alcohol, and unprescribed drugs. These chemicals affect the formation and growth of the baby.  Do not use any tobacco products, including cigarettes, chewing tobacco, and electronic cigarettes. If you need help quitting, ask your health care provider. You may receive counseling support and other resources to help you quit.  Follow your caregiver's instructions regarding medicine use. There are medicines that are either safe or unsafe to take during pregnancy.  Exercise only as directed by your caregiver. Experiencing uterine cramps is a good sign to stop exercising.  Continue to eat regular, healthy meals.  Wear a good support bra for breast tenderness.  Do not use hot tubs, steam rooms, or saunas.  Wear your seat belt at all times when driving.  Avoid raw meat, uncooked cheese, cat litter boxes, and soil used by cats. These carry germs that can cause birth defects in the baby.  Take your prenatal vitamins.  Take 1500-2000 mg of calcium daily starting at the 20th week of pregnancy until you deliver your baby.  Try taking a stool softener (if your caregiver approves) if you develop constipation. Eat more high-fiber foods, such as fresh vegetables or fruit and whole grains. Drink plenty of fluids to keep your urine clear or pale yellow.  Take warm sitz baths to soothe any pain or discomfort caused by hemorrhoids. Use hemorrhoid cream if your caregiver approves.  If  you develop varicose veins, wear support hose. Elevate your feet for 15 minutes, 3-4 times a day. Limit salt in your diet.  Avoid heavy lifting, wear low heal shoes, and practice good posture.  Rest a lot with your legs elevated if you have leg cramps or low back pain.  Visit your dentist if you have not gone during your pregnancy. Use a soft toothbrush to brush your teeth and be gentle when you floss.  A sexual relationship may be continued unless your caregiver directs you otherwise.  Do not travel far distances unless it is absolutely necessary and only with the approval of your caregiver.  Take prenatal classes to understand, practice, and ask questions about the labor and delivery.  Make a trial run to the hospital.  Pack your hospital bag.  Prepare the baby's nursery.  Continue to go to all your prenatal visits as directed by your caregiver. SEEK MEDICAL CARE IF:  You are unsure if you are in labor or if your water has broken.  You have dizziness.  You have   mild pelvic cramps, pelvic pressure, or nagging pain in your abdominal area.  You have persistent nausea, vomiting, or diarrhea.  You have a bad smelling vaginal discharge.  You have pain with urination. SEEK IMMEDIATE MEDICAL CARE IF:   You have a fever.  You are leaking fluid from your vagina.  You have spotting or bleeding from your vagina.  You have severe abdominal cramping or pain.  You have rapid weight loss or gain.  You have shortness of breath with chest pain.  You notice sudden or extreme swelling of your face, hands, ankles, feet, or legs.  You have not felt your baby move in over an hour.  You have severe headaches that do not go away with medicine.  You have vision changes.   This information is not intended to replace advice given to you by your health care provider. Make sure you discuss any questions you have with your health care provider.   Document Released: 05/12/2001 Document  Revised: 06/08/2014 Document Reviewed: 07/19/2012 Elsevier Interactive Patient Education 2016 Elsevier Inc.   Preterm Labor Information Preterm labor is when labor starts at less than 37 weeks of pregnancy. The normal length of a pregnancy is 39 to 41 weeks. CAUSES Often, there is no identifiable underlying cause as to why a woman goes into preterm labor. One of the most common known causes of preterm labor is infection. Infections of the uterus, cervix, vagina, amniotic sac, bladder, kidney, or even the lungs (pneumonia) can cause labor to start. Other suspected causes of preterm labor include:   Urogenital infections, such as yeast infections and bacterial vaginosis.   Uterine abnormalities (uterine shape, uterine septum, fibroids, or bleeding from the placenta).   A cervix that has been operated on (it may fail to stay closed).   Malformations in the fetus.   Multiple gestations (twins, triplets, and so on).   Breakage of the amniotic sac.  RISK FACTORS  Having a previous history of preterm labor.   Having premature rupture of membranes (PROM).   Having a placenta that covers the opening of the cervix (placenta previa).   Having a placenta that separates from the uterus (placental abruption).   Having a cervix that is too weak to hold the fetus in the uterus (incompetent cervix).   Having too much fluid in the amniotic sac (polyhydramnios).   Taking illegal drugs or smoking while pregnant.   Not gaining enough weight while pregnant.   Being younger than 18 and older than 35 years old.   Having a low socioeconomic status.   Being African American. SYMPTOMS Signs and symptoms of preterm labor include:   Menstrual-like cramps, abdominal pain, or back pain.  Uterine contractions that are regular, as frequent as six in an hour, regardless of their intensity (may be mild or painful).  Contractions that start on the top of the uterus and spread down to  the lower abdomen and back.   A sense of increased pelvic pressure.   A watery or bloody mucus discharge that comes from the vagina.  TREATMENT Depending on the length of the pregnancy and other circumstances, your health care provider may suggest bed rest. If necessary, there are medicines that can be given to stop contractions and to mature the fetal lungs. If labor happens before 34 weeks of pregnancy, a prolonged hospital stay may be recommended. Treatment depends on the condition of both you and the fetus.  WHAT SHOULD YOU DO IF YOU THINK YOU ARE IN PRETERM LABOR?   Call your health care provider right away. You will need to go to the hospital to get checked immediately. HOW CAN YOU PREVENT PRETERM LABOR IN FUTURE PREGNANCIES? You should:   Stop smoking if you smoke.  Maintain healthy weight gain and avoid chemicals and drugs that are not necessary.  Be watchful for any type of infection.  Inform your health care provider if you have a known history of preterm labor.   This information is not intended to replace advice given to you by your health care provider. Make sure you discuss any questions you have with your health care provider.   Document Released: 08/08/2003 Document Revised: 01/18/2013 Document Reviewed: 06/20/2012 Elsevier Interactive Patient Education 2016 Elsevier Inc.  

## 2016-03-03 NOTE — Progress Notes (Signed)
   PRENATAL VISIT NOTE  Subjective:  Destiny Perry is a [age] y.o. [id number] at 9271w2d being seen today for ongoing prenatal care.  She is currently monitored for the following issues for this high-risk pregnancy and has Supervision of other normal pregnancy, antepartum; Antepartum multigravida of advanced maternal age; Late prenatal care affecting pregnancy, antepartum; Substance abuse affecting pregnancy, antepartum; Abnormal chromosomal and genetic finding on antenatal screening mother; Allergic to bees; and Asthma complicating pregnancy, antepartum on her problem list.  Patient reports vaginal bleeding started after intercourse yesterday, spotting continues today.. Also she reports an episode of feeling  hot flushed and nauseous in waiting room today that resolved with PO fluids and rest.  Contractions: Irritability. Vag. Bleeding: None.  Movement: Present. Denies leaking of fluid.   The following portions of the patient's history were reviewed and updated as appropriate: allergies, current medications, past family history, past medical history, past social history, past surgical history and problem list. Problem list updated.  Objective:   Vitals:   03/03/16 1408  BP: 107/60  Pulse: 77  Temp: 97.4 F (36.3 C)    Fetal Status: Fetal Heart Rate (bpm): 140 lb Fundal Height: 33 cm Movement: Present     General:  Alert, oriented and cooperative. Patient is in no acute distress.  Skin: Skin is warm and dry. No rash noted.   Cardiovascular: Normal heart rate noted  Respiratory: Normal respiratory effort, no problems with respiration noted  Abdomen: Soft, gravid, appropriate for gestational age. Pain/Pressure: Present     Pelvic:  Cervical exam performed Dilation: 2      Extremities: Normal range of motion.  Edema: None  Mental Status: Normal mood and affect. Normal behavior. Normal judgment and thought content.   Urinalysis:      Assessment and Plan:  Pregnancy: [id number] at 3871w2d  1.  Asthma complicating pregnancy, antepartum --Renewed rescue inhaler.  2. Allergic to bees  - EPINEPHrine 0.3 mg/0.3 mL IJ SOAJ injection; Inject 0.3 mLs (0.3 mg total) into the muscle once.  Dispense: 1 Device; Refill: 2  3. Substance abuse affecting pregnancy in third trimester, antepartum --Positive cocaine and opiates in July. Retest in third trimester. - Pain Mgmt, Profile 6 Conf w/o mM, U  4. Abnormal chromosomal and genetic finding on antenatal screening mother --Quad screen abnormal, Panorama wnl  5. Supervision of high risk pregnancy in second trimester --Hx preterm birth at 2435 weeks  6. Vaginal bleeding in pregnancy, third trimester --Cervix 2/80/-2, frothy pink/red discharge - Wet prep, genital  Preterm labor symptoms and general obstetric precautions including but not limited to vaginal bleeding, contractions, leaking of fluid and fetal movement were reviewed in detail with the patient. Please refer to After Visit Summary for other counseling recommendations.  Return in about 2 weeks (around 03/17/2016).  Wilmer FloorLisa A Leftwich-Kirby, CNM   Addendum:  Pt left without providing a urine sample.

## 2016-03-04 LAB — WET PREP, GENITAL: Trich, Wet Prep: NONE SEEN

## 2016-03-06 LAB — CULTURE, BETA STREP (GROUP B ONLY)

## 2016-03-07 ENCOUNTER — Other Ambulatory Visit: Payer: Self-pay | Admitting: Advanced Practice Midwife

## 2016-03-07 DIAGNOSIS — B3731 Acute candidiasis of vulva and vagina: Secondary | ICD-10-CM

## 2016-03-07 DIAGNOSIS — B373 Candidiasis of vulva and vagina: Secondary | ICD-10-CM

## 2016-03-07 MED ORDER — FLUCONAZOLE 150 MG PO TABS: ORAL_TABLET | ORAL | 0 refills | Status: DC

## 2016-03-09 ENCOUNTER — Other Ambulatory Visit: Payer: Self-pay

## 2016-03-09 DIAGNOSIS — B3731 Acute candidiasis of vulva and vagina: Secondary | ICD-10-CM

## 2016-03-09 DIAGNOSIS — B373 Candidiasis of vulva and vagina: Secondary | ICD-10-CM

## 2016-03-09 MED ORDER — FLUCONAZOLE 150 MG PO TABS: ORAL_TABLET | ORAL | 0 refills | Status: AC

## 2016-03-09 NOTE — Telephone Encounter (Signed)
Spoke with patient regarding yeast infection. Patient stated she has taken diflucan in the past and it work well.Patient ask if we could call in Diflucan to [facility] in [place] since she is visiting her mother. Explain to patient we could call this in since she was having a little discomfort from having the yeast.

## 2016-03-18 ENCOUNTER — Encounter: Payer: Self-pay | Admitting: Advanced Practice Midwife

## 2016-03-18 ENCOUNTER — Encounter: Payer: Self-pay | Admitting: Family Medicine

## 2016-03-25 ENCOUNTER — Encounter: Payer: Self-pay | Admitting: Obstetrics and Gynecology

## 2016-04-01 ENCOUNTER — Encounter: Payer: Self-pay | Admitting: Obstetrics and Gynecology

## 2016-10-10 ENCOUNTER — Encounter (HOSPITAL_COMMUNITY): Payer: Self-pay

## 2017-11-14 ENCOUNTER — Encounter (HOSPITAL_COMMUNITY): Payer: Self-pay | Admitting: Nurse Practitioner

## 2017-11-14 ENCOUNTER — Emergency Department (HOSPITAL_COMMUNITY): Payer: Medicaid Other

## 2017-11-14 ENCOUNTER — Other Ambulatory Visit: Payer: Self-pay

## 2017-11-14 ENCOUNTER — Emergency Department (HOSPITAL_COMMUNITY)
Admission: EM | Admit: 2017-11-14 | Discharge: 2017-11-14 | Disposition: A | Discharge status: Home / Self Care | Payer: Medicaid Other | Attending: Emergency Medicine | Admitting: Emergency Medicine

## 2017-11-14 DIAGNOSIS — F17210 Nicotine dependence, cigarettes, uncomplicated: Secondary | ICD-10-CM | POA: Insufficient documentation

## 2017-11-14 DIAGNOSIS — G4489 Other headache syndrome: Secondary | ICD-10-CM | POA: Diagnosis not present

## 2017-11-14 DIAGNOSIS — J019 Acute sinusitis, unspecified: Secondary | ICD-10-CM

## 2017-11-14 DIAGNOSIS — Z79899 Other long term (current) drug therapy: Secondary | ICD-10-CM | POA: Diagnosis not present

## 2017-11-14 DIAGNOSIS — R0981 Nasal congestion: Secondary | ICD-10-CM | POA: Diagnosis present

## 2017-11-14 MED ORDER — AMOXICILLIN-POT CLAVULANATE 875-125 MG PO TABS: 1.0000 | ORAL_TABLET | Freq: Two times a day (BID) | ORAL | 0 refills | Status: AC

## 2017-11-14 MED ORDER — PREDNISONE 50 MG PO TABS: ORAL_TABLET | ORAL | 0 refills | Status: AC

## 2017-11-14 NOTE — Discharge Instructions (Signed)
Schedule to see Dr. Annalee GentaShoemaker for evaluation

## 2017-11-14 NOTE — ED Provider Notes (Signed)
[facility]-EMERGENCY DEPT Provider Note   CSN: [id number] Arrival date & time: 11/14/17  1848     History   Chief Complaint Chief Complaint  Patient presents with  . Sinus Problem    HPI Destiny Perry is a 37 y.o. female with hx of substance abuse and asthma who presents to the ED with c/o sinus problems. Patient reports she has had the problem since last year in October. Patient reports she has been to [facility] and treated with Z-Pak, Augmentin, Omnicef , Amoxicillin and Penicillin. Patient states nothing has helped and she is now having ear pain and continues to have congestion. Patient reports taking OTC medications for congestion without relief. Patient reports no x-rays have been done and no referral to ENT. The history is provided by the patient. No language interpreter was used.  Headache   This is a new problem. The current episode started more than 1 week ago. The problem occurs constantly. The problem has been gradually worsening. The pain is at a severity of 9/10. The pain radiates to the left neck. Pertinent negatives include no palpitations, no syncope, no shortness of breath, no nausea and no vomiting. Fever: low grade.    Past Medical History:  Diagnosis Date  . Bronchitis, chronic [facility](HCC)     Patient Active Problem List   Diagnosis Date Noted  . 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 Asthma complicating pregnancy, antepartum 03/03/2016  . Substance abuse affecting pregnancy, antepartum 12/30/2015  . Abnormal chromosomal and genetic finding on antenatal screening mother 12/30/2015  . Supervision of other normal pregnancy, antepartum 12/19/2015  . Antepartum multigravida of advanced maternal age 66/20/2017  . Late prenatal care affecting pregnancy, antepartum 12/19/2015    Past Surgical History:  Procedure Laterality Date  . leap procedure    . LEEP    . WISDOM TOOTH EXTRACTION       OB History    Gravida  3   Para  1   Term  0   Preterm  1   AB  1   Living  1     SAB  1   TAB  0   Ectopic  0   Multiple  0   Live Births  1            Home Medications    Prior to Admission medications   Medication Sig Start Date End Date Taking? Authorizing Provider  albuterol (PROVENTIL HFA;VENTOLIN HFA) 108 (90 Base) MCG/ACT inhaler Inhale 2 puffs into the lungs every 6 (six) hours as needed for wheezing or shortness of breath. 03/03/16   Leftwich-Kirby, Wilmer FloorLisa A, CNM  fluconazole (DIFLUCAN) 150 MG tablet Take one tablet now and one in 3 days. 03/09/16   Leftwich-Kirby, Wilmer FloorLisa A, CNM  Prenatal Multivit-Min-Fe-FA (PRENATAL VITAMINS) 0.8 MG tablet Take 1 tablet by mouth daily. 03/03/16   Leftwich-Kirby, Wilmer FloorLisa A, CNM    Family History Family History  Problem Relation Age of Onset  . Hypertension Mother   . Hyperlipidemia Mother   . Heart disease Maternal Aunt   . Heart disease Maternal Uncle   . Cancer Paternal Aunt        breast cancer  . Heart disease Maternal Grandmother   . Heart disease Maternal Grandfather     Social History Social History   Tobacco Use  . Smoking status: Current Every Day Smoker    Packs/day: 0.50    Types: Cigarettes  . Smokeless tobacco: Never Used  Substance Use Topics  .  Alcohol use: No  . Drug use: No     Allergies   Bee venom and Metronidazole   Review of Systems Review of Systems  Constitutional: Positive for chills. Fever: low grade.  HENT: Positive for congestion, ear pain, postnasal drip, sinus pressure and sinus pain. Negative for hearing loss, nosebleeds, sore throat and trouble swallowing.   Eyes: Positive for photophobia and discharge. Negative for pain, redness and itching.  Respiratory: Negative for cough, shortness of breath and wheezing.   Cardiovascular: Negative for palpitations and syncope.  Gastrointestinal: Negative for diarrhea, nausea and vomiting.  Genitourinary: Negative for dysuria, frequency and urgency.  Musculoskeletal: Positive for back pain.  Negative for neck pain and neck stiffness.  Skin: Negative for rash.  Neurological: Positive for light-headedness and headaches. Negative for syncope.  Psychiatric/Behavioral: Negative for confusion. The patient is not nervous/anxious.      Physical Exam Updated Vital Signs BP 103/69 (BP Location: Left Arm)   Pulse 76   Temp 98.4 F (36.9 C) (Oral)   Resp 20   SpO2 98%   Physical Exam  Constitutional: She is oriented to person, place, and time. She appears well-developed and well-nourished. No distress.  HENT:  Head: Normocephalic and atraumatic.  Right Ear: Tympanic membrane normal.  Left Ear: Tympanic membrane normal.  Nose: Mucosal edema and rhinorrhea present. Right sinus exhibits maxillary sinus tenderness. Left sinus exhibits maxillary sinus tenderness.  Mouth/Throat: Uvula is midline and mucous membranes are normal. No posterior oropharyngeal edema or posterior oropharyngeal erythema.  Eyes: Pupils are equal, round, and reactive to light. Conjunctivae and EOM are normal.  Neck: Normal range of motion. Neck supple. Muscular tenderness present.  Cardiovascular: Normal rate and regular rhythm.  Pulmonary/Chest: Effort normal and breath sounds normal.  Musculoskeletal: Normal range of motion.  Lymphadenopathy:    She has cervical adenopathy.  Neurological: She is alert and oriented to person, place, and time. She has normal strength. She displays a negative Romberg sign. Gait normal.  Reflex Scores:      Bicep reflexes are 2+ on the right side and 2+ on the left side.      Brachioradialis reflexes are 2+ on the right side and 2+ on the left side.      Patellar reflexes are 2+ on the right side and 2+ on the left side. Stands on one foot without difficulty.  Skin: Skin is warm and dry.  Psychiatric: She has a normal mood and affect. Her behavior is normal.  Nursing note and vitals reviewed.    ED Treatments / Results  Labs (all labs ordered are listed, but only abnormal  results are displayed) Labs Reviewed - No data to display  Radiology No results found.  Procedures Procedures (including critical care time)  Medications Ordered in ED Medications - No data to display   Initial Impression / Assessment and Plan / ED Course  I have reviewed the triage vital signs and the nursing notes.   Final Clinical Impressions(s) / ED Diagnoses   Final diagnoses:  Other headache syndrome   Patient awaiting CT scan. Care turned over to [facility] @ 8:45 pm.    Janne Napoleon, NP 11/14/17 2043    Mancel Bale, MD 11/17/17 [phone]

## 2017-11-14 NOTE — ED Triage Notes (Signed)
Pt is c/o a sinus problem that has been ongoing for since last year October.

## 2018-03-29 ENCOUNTER — Ambulatory Visit: Payer: Self-pay | Admitting: Advanced Practice Midwife

## 2018-03-29 NOTE — Progress Notes (Deleted)
Subjective:     Destiny Perry is a 37 y.o. female here for a routine exam.  Current complaints: ***.  Personal health questionnaire reviewed: {yes/no:9010}.   Gynecologic History No LMP recorded. Contraception: {method:5051} Last Pap: 12/19/15. Results were: normal Last mammogram: n/a.  Obstetric History OB History  Gravida Para Term Preterm AB Living  3 1 0 1 1 1   SAB TAB Ectopic Multiple Live Births  1 0 0 0 1    # Outcome Date GA Lbr Len/2nd Weight Sex Delivery Anes PTL Lv  3 Gravida           2 Preterm 2006 [redacted]w[redacted]d   F Vag-Spont EPI Y LIV  1 SAB 2001             {Common ambulatory SmartLinks:19316}  Review of Systems {ros; complete:30496}    Objective:    {exam; complete:18323}    Assessment:    Healthy female exam.    Plan:    {plan:19193}

## 2018-06-08 IMAGING — US US MFM OB DETAIL+14 WK
1 series · 14 of 28 positions shown · non-contrast
Comparison: none

[Series 1: us mfm ob detail+14 wk · 57 acquisitions, 14 frames shown]
[im 3/57]
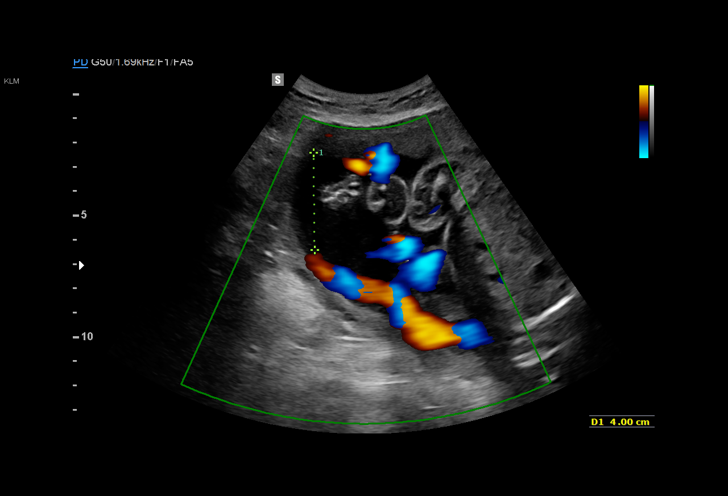
[im 7/57]
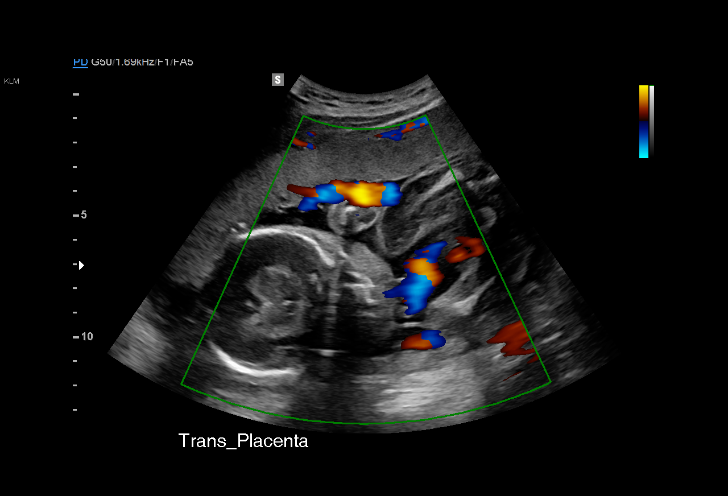
[im 11/57]
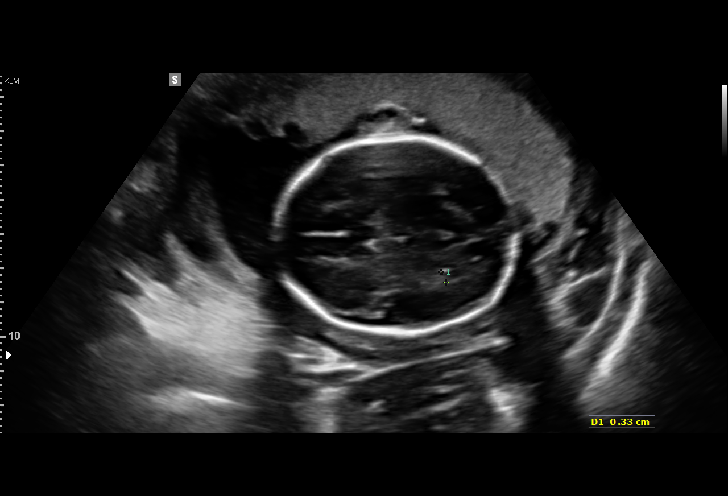
[im 15/57]
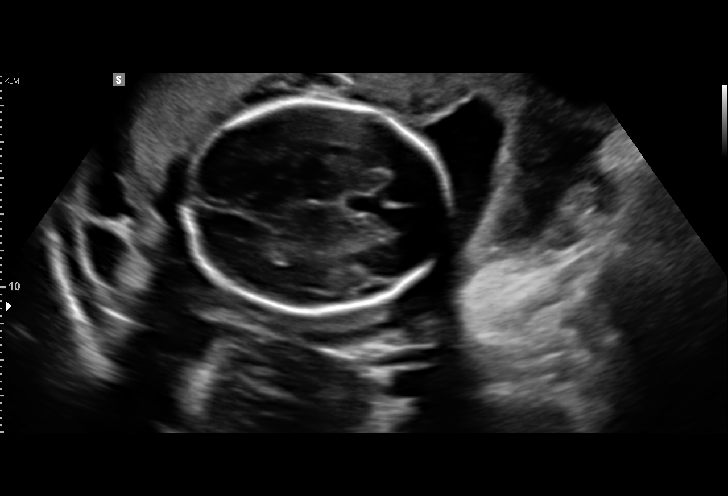
[im 19/57]
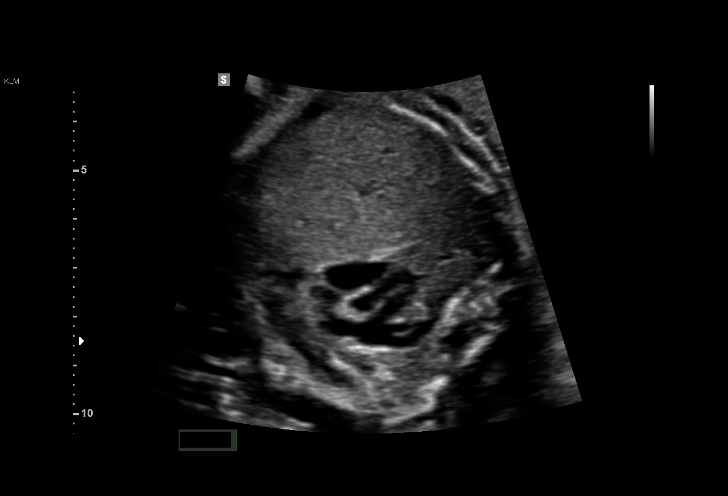
[im 23/57]
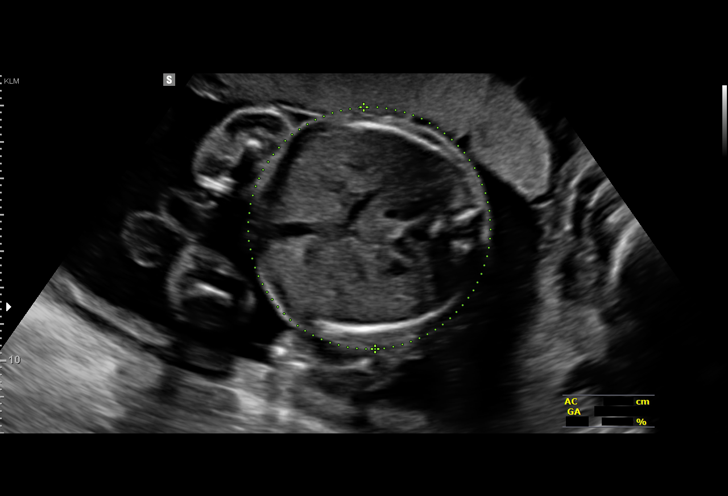
[im 27/57]
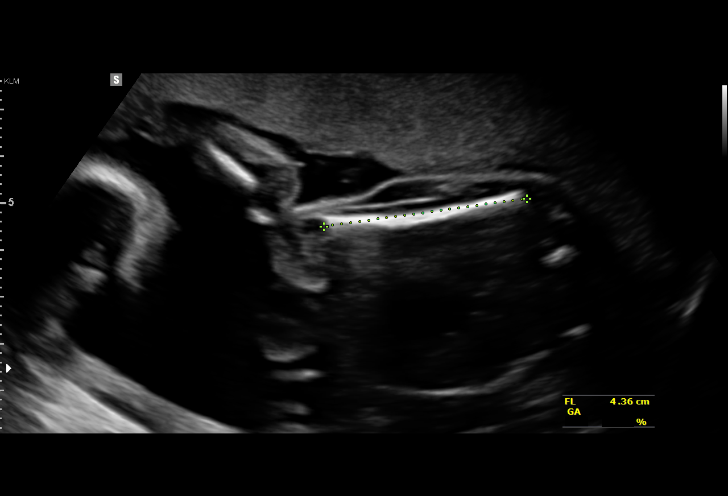
[im 32/57]
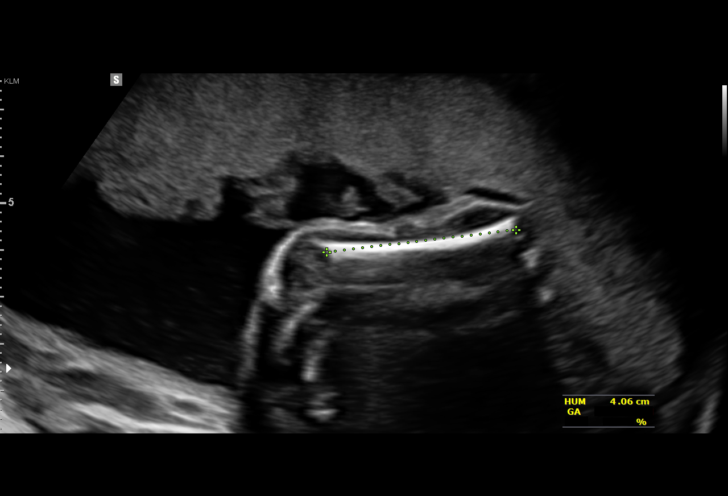
[im 36/57]
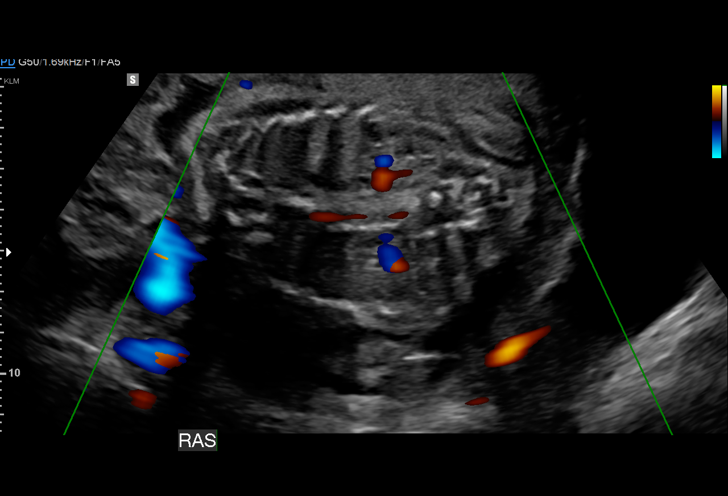
[im 40/57]
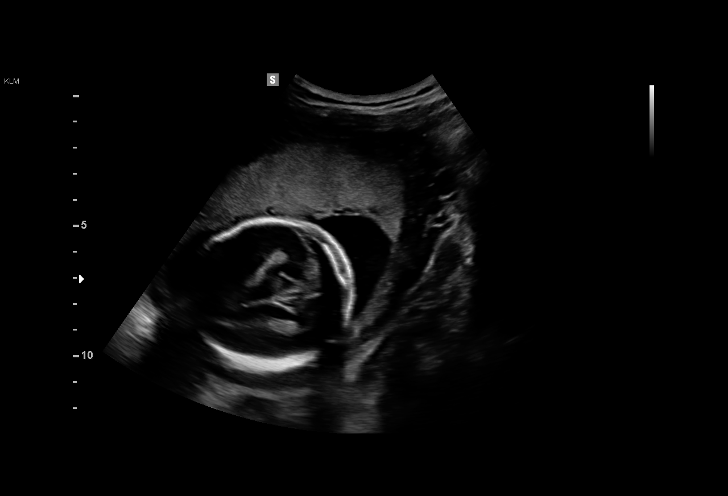
[im 44/57]
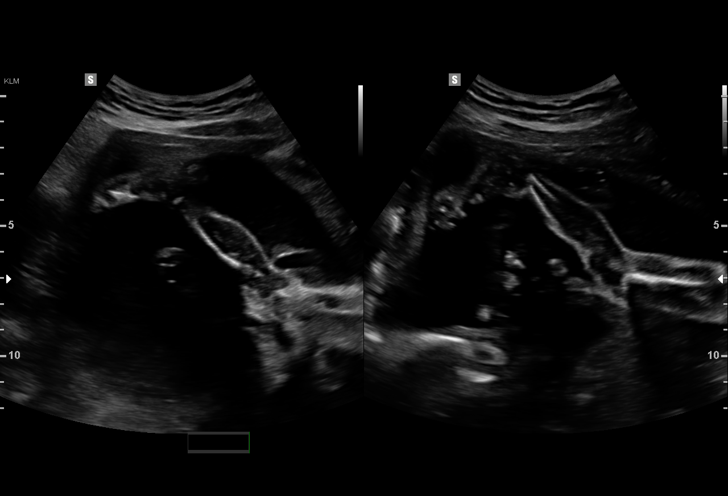
[im 48/57]
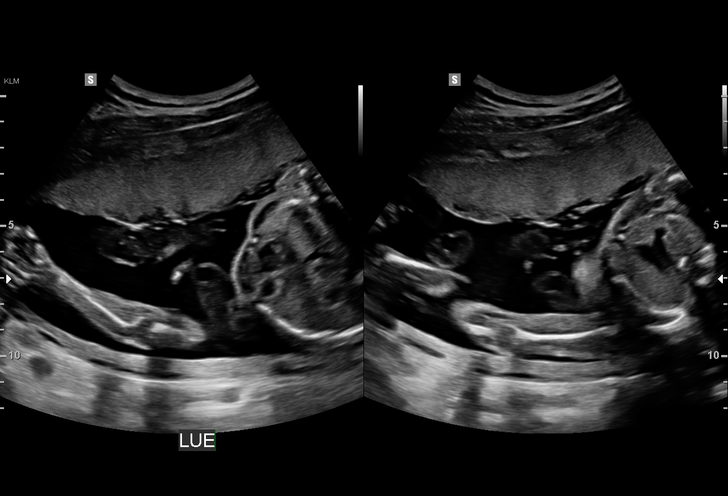
[im 52/57]
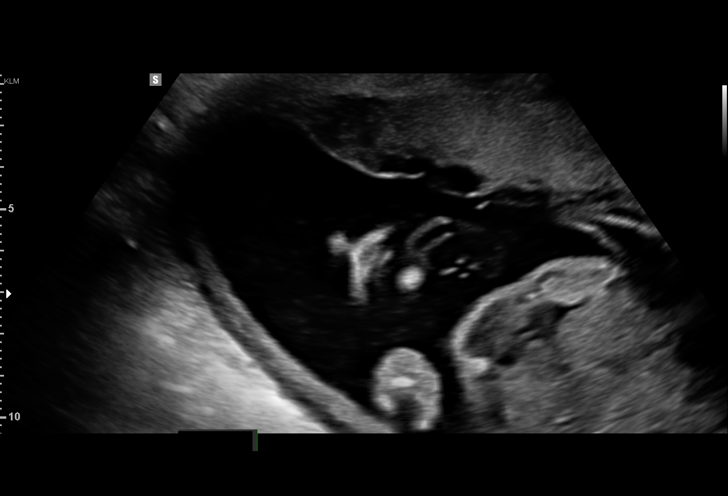
[im 57/57]
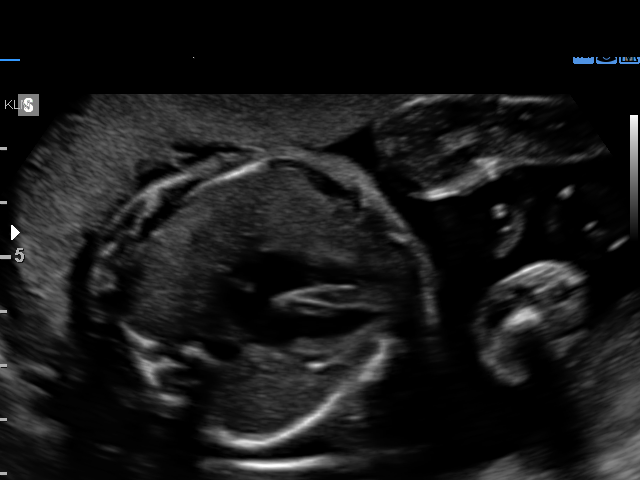

[14 of 28 positions shown; findings below may reference images not displayed]

[facility]-
Faculty Physician
OB/Gyn Clinic
[REDACTED]

1  BENRABAH ETOIL             [id number]      [id number]     [id number]
Indications

24 weeks gestation of pregnancy
Abnormal biochemical screen (quad) for
Trisomy 18 ([DATE])
Smoking complicating pregnancy, second
trimester
Advanced maternal age multigravida 35+,
second trimester
Detailed fetal anatomic survey                 [id number]
OB History

Gravidity:    3         Term:   1        Prem:   0        SAB:   1
TOP:          0       Ectopic:  0        Living: 1
Fetal Evaluation

Num Of Fetuses:     1
Fetal Heart         155
Rate(bpm):
Cardiac Activity:   Observed
Presentation:       Transverse, head to maternal right
Placenta:           Anterior, above cervical os
P. Cord Insertion:  Visualized
Amniotic Fluid
AFI FV:      Subjectively within normal limits

Largest Pocket(cm)
4
Biometry

BPD:      58.6  mm     G. Age:  24w 0d         32  %    CI:         74.2   %   70 - 86
FL/HC:      19.7   %   18.7 -
HC:       216   mm     G. Age:  23w 4d         13  %    HC/AC:      1.03       1.05 -
AC:      208.9  mm     G. Age:  25w 3d         76  %    FL/BPD:     72.7   %   71 - 87
FL:       42.6  mm     G. Age:  24w 0d         26  %    FL/AC:      20.4   %   20 - 24
HUM:      40.6  mm     G. Age:  24w 5d         50  %

Est. FW:     718  gm      1 lb 9 oz     59  %
Gestational Age

LMP:           23w 3d       Date:   07/13/15                 EDD:   04/18/16
U/S Today:     24w 2d                                        EDD:   04/12/16
Best:          24w 2d    Det. By:   24 weeks gestation of    EDD:   04/12/16
pregnancy
Anatomy

Cranium:               Appears normal         Aortic Arch:            Appears normal
Cavum:                 Appears normal         Ductal Arch:            Appears normal
Ventricles:            Appears normal         Diaphragm:              Appears normal
Choroid Plexus:        Appears normal         Stomach:                Appears normal, left
sided
Cerebellum:            Appears normal         Abdomen:                Appears normal
Posterior Fossa:       Appears normal         Abdominal Wall:         Appears nml (cord
insert, abd wall)
Nuchal Fold:           Not applicable (>20    Cord Vessels:           Appears normal (3
wks GA)                                        vessel cord)
Face:                  Appears normal         Kidneys:                Appear normal
(orbits and profile)
Lips:                  Appears normal         Bladder:                Appears normal
Thoracic:              Appears normal         Spine:                  Not well visualized
Heart:                 Appears normal         Upper Extremities:      Appears normal
(4CH, axis, and situs
RVOT:                  Appears normal         Lower Extremities:      Appears normal
LVOT:                  Appears normal

Other:  Fetus appears to be a female. Heels visualized. Nasal bone
visualized. Technically difficult due to fetal position.
Cervix Uterus Adnexa

Cervix
Length:            4.5  cm.
Normal appearance by transabdominal scan.
Impression

Single IUP at 24w 2d
Increased Trisomy 18 risk by Quad screen ([DATE])
Normal detailed fetal anatomy
Limited views of the fetal heart obtained
No markers associated with aneuploidy appreciated
Fetal growth is appropriate (59th %tile)
Normal amniotic fluid volume
Recommendations

Recommend follow-up ultrasound examination for growth,
reevaluate the spine in 6 weeks
The patient elected to undergo NIPT due to increased Tri 18
risk by Quad screen.

## 2020-04-29 IMAGING — CT CT MAXILLOFACIAL W/O CM
3 series · 15 of 47 positions shown, 18 images · non-contrast
Comparison: None.

CLINICAL DATA: 37 y/o F; persistent sinus infection post 2 rounds
of antibiotics without relief. Bilateral maxillary and left greater
than right ear pain.

EXAM:
CT MAXILLOFACIAL WITHOUT CONTRAST
TECHNIQUE: Multidetector CT images of the paranasal sinuses were obtained using
the standard protocol without intravenous contrast.

[Series 3: max soft · axial · 0.36mm/px · z∈[-134,-4]mm · 9 of 77 slices shown, 12 images]
[im 6/77  brain]
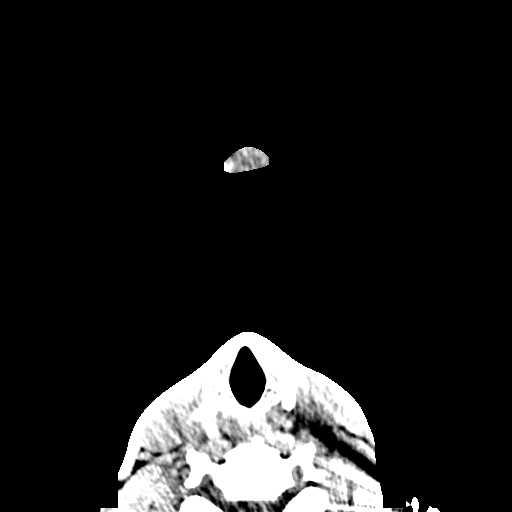
[im 6/77  bone]
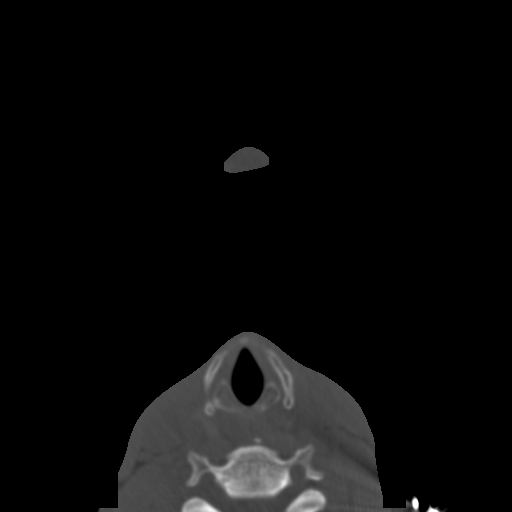
[im 14/77  bone]
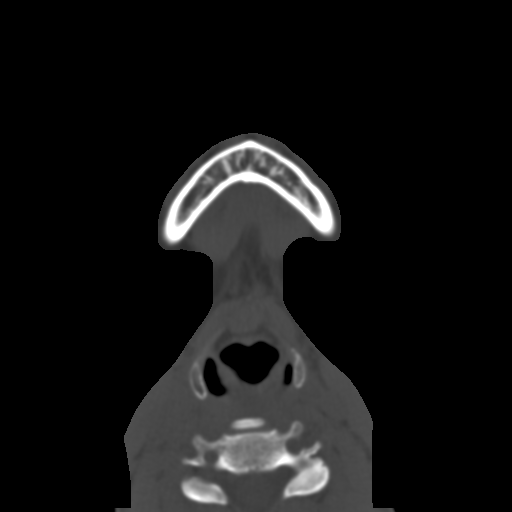
[im 21/77  bone]
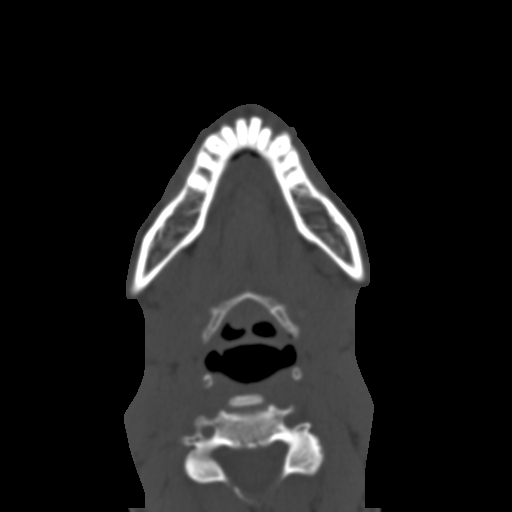
[im 29/77  bone]
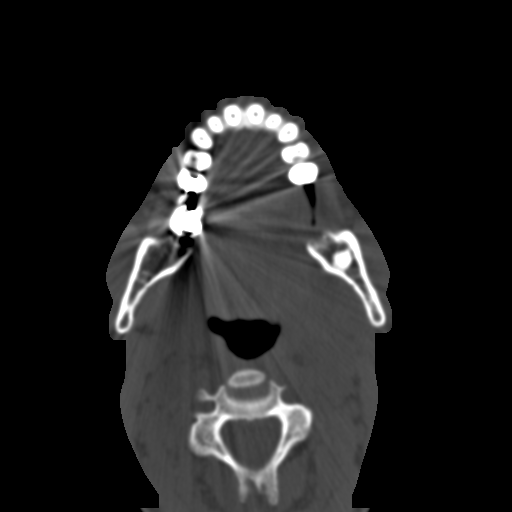
[im 40/77  brain]
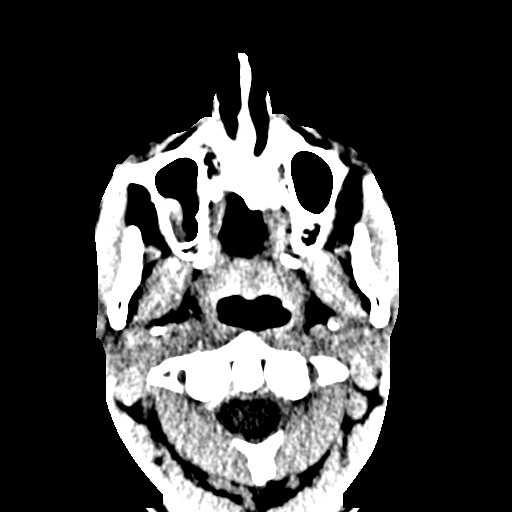
[im 40/77  bone]
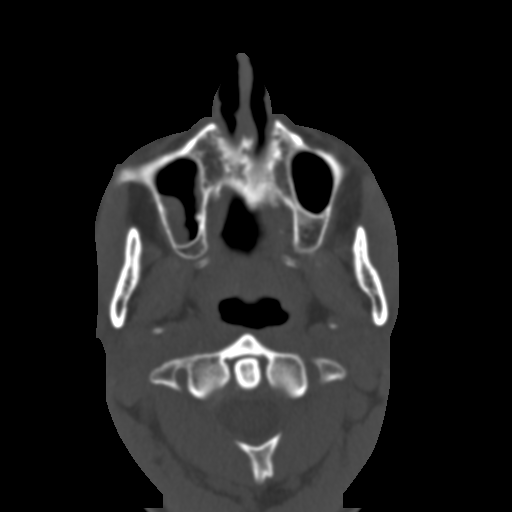
[im 48/77  bone]
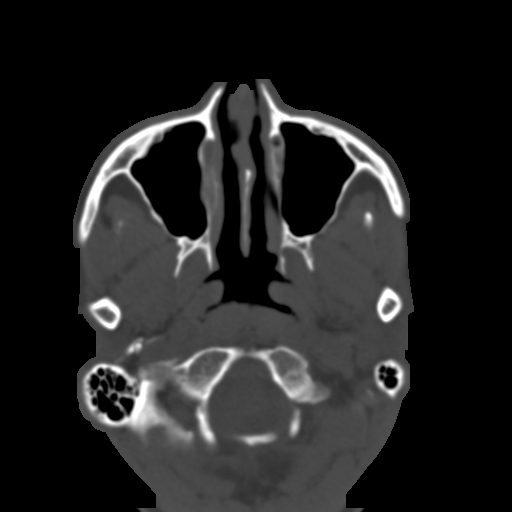
[im 56/77  bone]
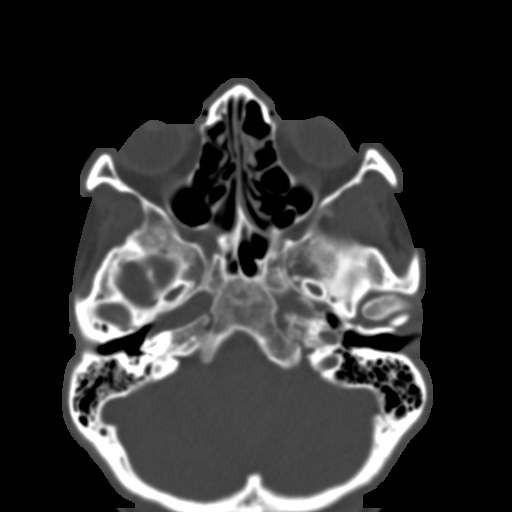
[im 63/77  bone]
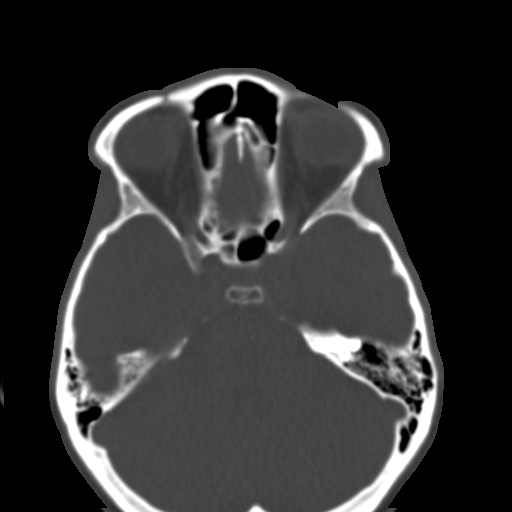
[im 71/77  brain]
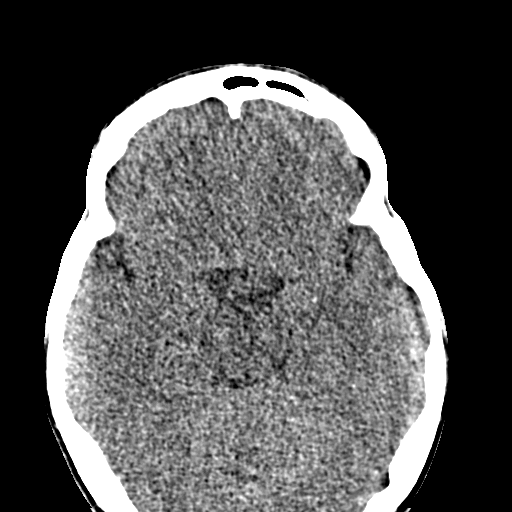
[im 71/77  bone]
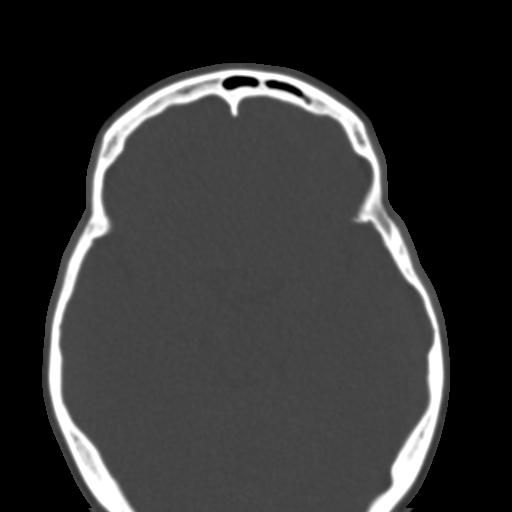

[Series 7: coronal soft · coronal · 0.29mm/px · 3 of 67 slices shown]
[im 23/67  bone]
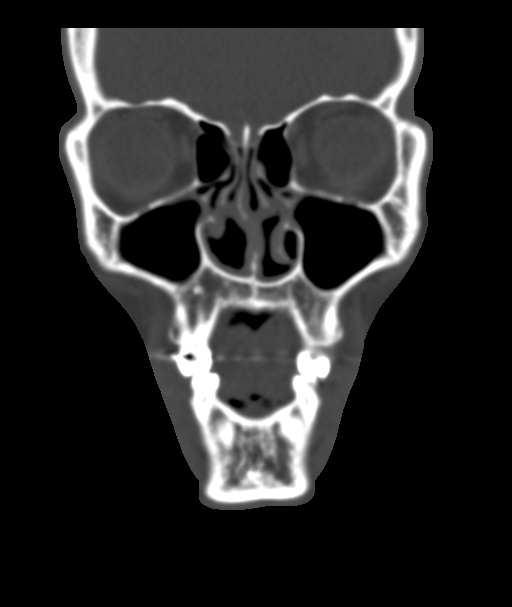
[im 30/67  bone]
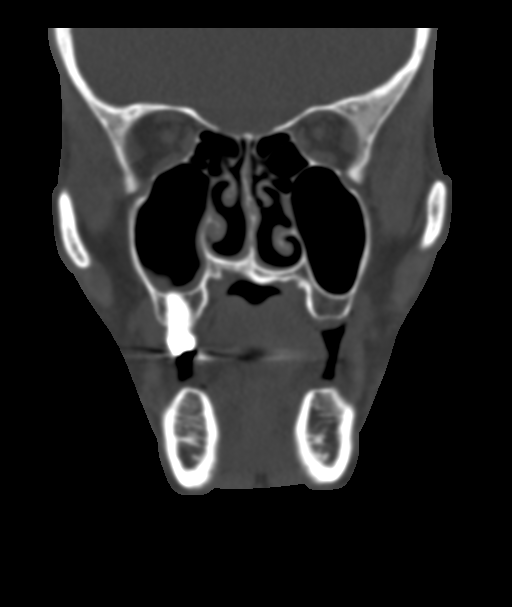
[im 37/67  bone]
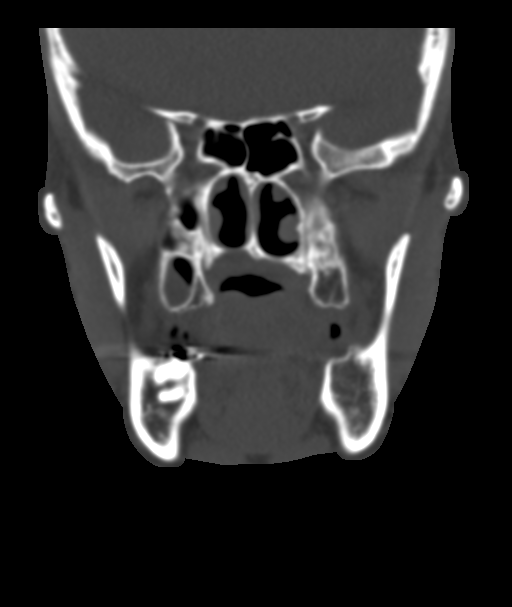

[Series 8: sagittal soft · sagittal · 0.32mm/px · 3 of 71 slices shown]
[im 24/71  bone]
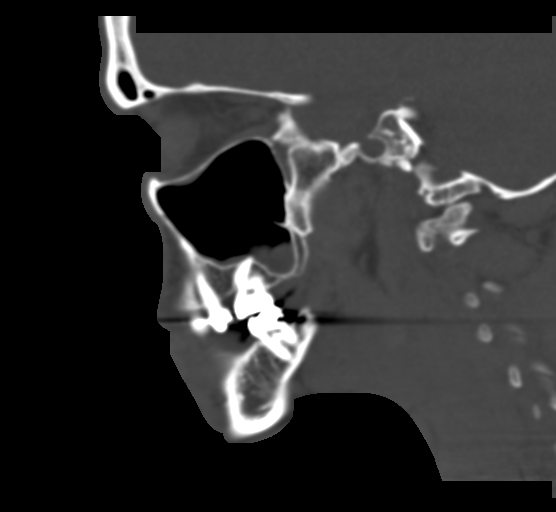
[im 36/71  bone]
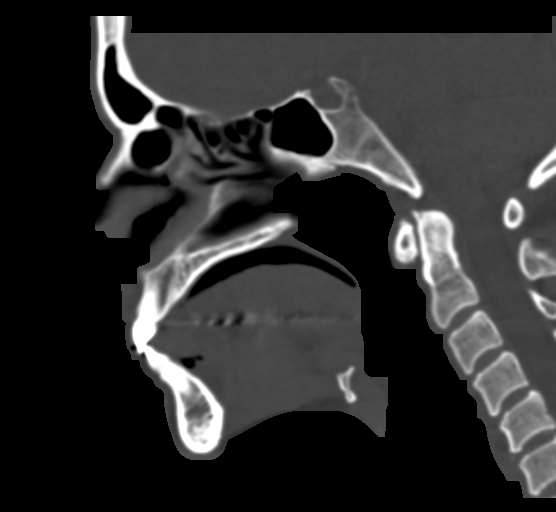
[im 47/71  bone]
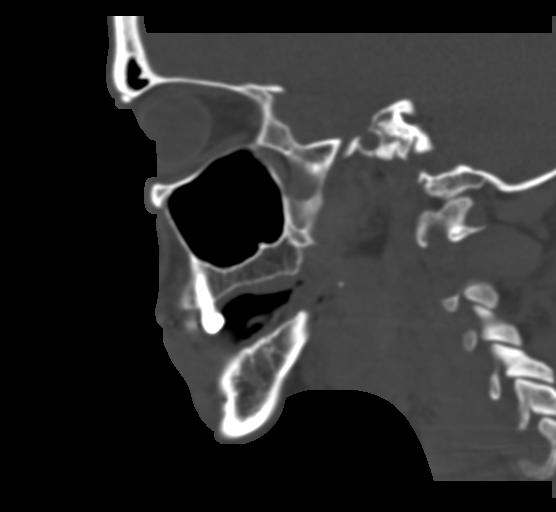

[15 of 47 positions shown; findings below may reference images not displayed]

FINDINGS: Paranasal sinuses:

Frontal: Normally aerated. Patent right frontal sinus drainage
pathway. Partially occluded left frontal sinus drainage pathway.

Ethmoid: Mild mucosal thickening of the anterior ethmoid air cells
bilaterally.

Maxillary: Mild right maxillary sinus mucosal thickening with small
mucous retention cysts within the alveolar recess. Normal aeration
of the left maxillary sinus.

Sphenoid: Normally aerated. Patent sphenoethmoidal recesses.

Right ostiomeatal unit: Patent with moderate mucosal thickening.

Left ostiomeatal unit: Patent with moderate mucosal thickening.

Nasal passages: 11 x 9 mm cartilaginous nasal septal perforation (AP
by CC). Rightward nasal septal deviation.

Anatomy: Pneumatization superior to anterior ethmoid notches.
Symmetric and intact olfactory grooves and fovea ethmoidalis, Keros
II (4-7mm). Presellar sphenoid pneumatization pattern. Small
dehiscence of the left carotid canal into the left superior sphenoid
sinus (series 4, image 15).

Other: Orbits and intracranial compartment are unremarkable. Visible
mastoid air cells are normally aerated.
IMPRESSION: 1. 11 mm nasal septal perforation.
2. Mild anterior ethmoid and right maxillary sinus mucosal
thickening. No sinus fluid levels or osteitis.
3. Mucosal thickening of patent ostiomeatal units. Partial
opacification of left frontal sinus drainage pathway.

By: Hou Chi Ilyan M.D.
